# Patient Record
Sex: Female | Born: 1995 | Race: White | Hispanic: No | Smoking: Never smoker
Health system: Southern US, Community
[De-identification: ages and names within clinical notes are randomized; demographics above are authoritative.]

## PROBLEM LIST (undated history)

## (undated) DIAGNOSIS — I471 Supraventricular tachycardia, unspecified: Secondary | ICD-10-CM

## (undated) DIAGNOSIS — R109 Unspecified abdominal pain: Secondary | ICD-10-CM

## (undated) DIAGNOSIS — I951 Orthostatic hypotension: Secondary | ICD-10-CM

## (undated) DIAGNOSIS — G43909 Migraine, unspecified, not intractable, without status migrainosus: Secondary | ICD-10-CM

## (undated) DIAGNOSIS — I498 Other specified cardiac arrhythmias: Secondary | ICD-10-CM

## (undated) DIAGNOSIS — R000 Tachycardia, unspecified: Secondary | ICD-10-CM

## (undated) DIAGNOSIS — Q796 Ehlers-Danlos syndrome, unspecified: Secondary | ICD-10-CM

## (undated) DIAGNOSIS — Z973 Presence of spectacles and contact lenses: Secondary | ICD-10-CM

## (undated) DIAGNOSIS — M797 Fibromyalgia: Secondary | ICD-10-CM

## (undated) DIAGNOSIS — R002 Palpitations: Secondary | ICD-10-CM

## (undated) DIAGNOSIS — G90A Postural orthostatic tachycardia syndrome (POTS): Secondary | ICD-10-CM

## (undated) HISTORY — PX: MOUTH SURGERY: SHX715

## (undated) HISTORY — PX: THYROIDECTOMY, PARTIAL: SHX18

## (undated) HISTORY — DX: Unspecified abdominal pain: R10.9

---
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
## 2017-01-18 DIAGNOSIS — R000 Tachycardia, unspecified: Secondary | ICD-10-CM

## 2017-01-18 DIAGNOSIS — G90A Postural orthostatic tachycardia syndrome (POTS): Secondary | ICD-10-CM | POA: Insufficient documentation

## 2017-01-18 DIAGNOSIS — I951 Orthostatic hypotension: Secondary | ICD-10-CM

## 2017-01-18 DIAGNOSIS — I498 Other specified cardiac arrhythmias: Secondary | ICD-10-CM | POA: Insufficient documentation

## 2017-04-22 ENCOUNTER — Emergency Department (HOSPITAL_COMMUNITY): Payer: BLUE CROSS/BLUE SHIELD

## 2017-04-22 ENCOUNTER — Emergency Department (HOSPITAL_COMMUNITY)
Admission: EM | Admit: 2017-04-22 | Discharge: 2017-04-22 | Disposition: A | Discharge status: Home / Self Care | Payer: BLUE CROSS/BLUE SHIELD | Attending: Emergency Medicine | Admitting: Emergency Medicine

## 2017-04-22 ENCOUNTER — Encounter (HOSPITAL_COMMUNITY): Payer: Self-pay | Admitting: *Deleted

## 2017-04-22 DIAGNOSIS — Z79899 Other long term (current) drug therapy: Secondary | ICD-10-CM | POA: Diagnosis not present

## 2017-04-22 DIAGNOSIS — R109 Unspecified abdominal pain: Secondary | ICD-10-CM | POA: Diagnosis present

## 2017-04-22 DIAGNOSIS — K5000 Crohn's disease of small intestine without complications: Secondary | ICD-10-CM | POA: Insufficient documentation

## 2017-04-22 HISTORY — DX: Postural orthostatic tachycardia syndrome (POTS): G90.A

## 2017-04-22 HISTORY — DX: Other specified cardiac arrhythmias: I49.8

## 2017-04-22 HISTORY — DX: Tachycardia, unspecified: R00.0

## 2017-04-22 HISTORY — DX: Ehlers-Danlos syndrome, unspecified: Q79.60

## 2017-04-22 HISTORY — DX: Orthostatic hypotension: I95.1

## 2017-04-22 LAB — CBC WITH DIFFERENTIAL/PLATELET
Basophils Absolute: 0 10*3/uL (ref 0.0–0.1)
Basophils Relative: 0 %
Eosinophils Absolute: 0.2 10*3/uL (ref 0.0–0.7)
Eosinophils Relative: 2 %
HEMATOCRIT: 45 % (ref 36.0–46.0)
HEMOGLOBIN: 15.5 g/dL — AB (ref 12.0–15.0)
LYMPHS PCT: 31 %
Lymphs Abs: 2.7 10*3/uL (ref 0.7–4.0)
MCH: 30.8 pg (ref 26.0–34.0)
MCHC: 34.4 g/dL (ref 30.0–36.0)
MCV: 89.5 fL (ref 78.0–100.0)
MONO ABS: 1 10*3/uL (ref 0.1–1.0)
MONOS PCT: 11 %
NEUTROS ABS: 5 10*3/uL (ref 1.7–7.7)
Neutrophils Relative %: 56 %
Platelets: 200 10*3/uL (ref 150–400)
RBC: 5.03 MIL/uL (ref 3.87–5.11)
RDW: 12 % (ref 11.5–15.5)
WBC: 8.8 10*3/uL (ref 4.0–10.5)

## 2017-04-22 LAB — COMPREHENSIVE METABOLIC PANEL
ALBUMIN: 4.7 g/dL (ref 3.5–5.0)
ALT: 23 U/L (ref 14–54)
AST: 28 U/L (ref 15–41)
Alkaline Phosphatase: 59 U/L (ref 38–126)
Anion gap: 8 (ref 5–15)
BUN: 10 mg/dL (ref 6–20)
CHLORIDE: 105 mmol/L (ref 101–111)
CO2: 28 mmol/L (ref 22–32)
Calcium: 10 mg/dL (ref 8.9–10.3)
Creatinine, Ser: 0.7 mg/dL (ref 0.44–1.00)
GFR calc Af Amer: >60 mL/min (ref >60)
GFR calc non Af Amer: >60 mL/min (ref >60)
GLUCOSE: 89 mg/dL (ref 65–99)
POTASSIUM: 3.6 mmol/L (ref 3.5–5.1)
SODIUM: 141 mmol/L (ref 135–145)
Total Bilirubin: 0.4 mg/dL (ref 0.3–1.2)
Total Protein: 8.3 g/dL — ABNORMAL HIGH (ref 6.5–8.1)

## 2017-04-22 LAB — URINALYSIS, ROUTINE W REFLEX MICROSCOPIC
BILIRUBIN URINE: NEGATIVE
GLUCOSE, UA: NEGATIVE mg/dL
KETONES UR: NEGATIVE mg/dL
NITRITE: NEGATIVE
PH: 5 (ref 5.0–8.0)
Protein, ur: NEGATIVE mg/dL
SPECIFIC GRAVITY, URINE: 1.011 (ref 1.005–1.030)

## 2017-04-22 LAB — LIPASE, BLOOD: Lipase: 30 U/L (ref 11–51)

## 2017-04-22 LAB — PREGNANCY, URINE: Preg Test, Ur: NEGATIVE

## 2017-04-22 MED ORDER — HYDROCODONE-ACETAMINOPHEN 5-325 MG PO TABS: 1.0000 | ORAL_TABLET | Freq: Four times a day (QID) | ORAL | 0 refills | Status: DC | PRN

## 2017-04-22 MED ORDER — SODIUM CHLORIDE 0.9 % IV SOLN
INTRAVENOUS | Status: DC
  Administered 2017-04-22: 21:00:00 via INTRAVENOUS

## 2017-04-22 MED ORDER — ONDANSETRON HCL 4 MG/2ML IJ SOLN
4.0000 mg | Freq: Once | INTRAMUSCULAR | Status: AC
  Administered 2017-04-22: 4 mg via INTRAVENOUS
  Filled 2017-04-22: qty 2

## 2017-04-22 MED ORDER — SODIUM CHLORIDE 0.9 % IV BOLUS (SEPSIS)
500.0000 mL | Freq: Once | INTRAVENOUS | Status: AC
  Administered 2017-04-22: 500 mL via INTRAVENOUS

## 2017-04-22 MED ORDER — IOPAMIDOL (ISOVUE-300) INJECTION 61%
100.0000 mL | Freq: Once | INTRAVENOUS | Status: AC | PRN
Start: 2017-04-22 — End: 2017-04-22
  Administered 2017-04-22: 100 mL via INTRAVENOUS

## 2017-04-22 NOTE — ED Provider Notes (Signed)
[facility] Provider Note   CSN: [id number] Arrival date & time: 04/22/17  1910     History   Chief Complaint Chief Complaint  Patient presents with  . Abdominal Pain    HPI Katelyn Vazquez is a 21 y.o. female.  Patient with the complaint of left-sided lower abdominal pain also some abdominal pain all over. Started last night. Seems to be worse with food. No nausea no vomiting no diarrhea. No fevers no dysuria. Patient's had similar symptoms on and off for the past year. Originally thought maybe it was related to GYN things had a GYN follow-up and ultrasound with no acute findings.      Past Medical History:  Diagnosis Date  . Ehlers-Danlos syndrome   . POTS (postural orthostatic tachycardia syndrome)     There are no active problems to display for this patient.   Past Surgical History:  Procedure Laterality Date  . MOUTH SURGERY      OB History    No data available       Home Medications    Prior to Admission medications   Medication Sig Start Date End Date Taking? Authorizing Provider  diltiazem (DILACOR XR) 120 MG 24 hr capsule Take 120 mg by mouth daily.   Yes [provider]  drospirenone-ethinyl estradiol (NIKKI) 3-0.02 MG tablet Take 1 tablet by mouth daily.   Yes [provider]  Eyelid Cleansers (AVENOVA) 0.01 % SOLN Apply 1 application topically daily.   Yes [provider]  fludrocortisone (FLORINEF) 0.1 MG tablet Take 0.2 mg by mouth daily.   Yes [provider]  gabapentin (NEURONTIN) 100 MG capsule Take 100 mg by mouth 2 (two) times daily.   Yes [provider]  loratadine (CLARITIN) 10 MG tablet Take 10 mg by mouth daily.   Yes [provider]  midodrine (PROAMATINE) 2.5 MG tablet Take 2.5 mg by mouth 3 (three) times daily with meals.   Yes [provider]  Multiple Vitamin (MULTIVITAMIN WITH MINERALS) TABS tablet Take 1 tablet by mouth daily.   Yes [provider]    Probiotic Product ([employer] ADV PROBIOTIC GUMMIES PO) Take 2 each by mouth daily.   Yes [provider]  SUMAtriptan (IMITREX) 50 MG tablet Take 50 mg by mouth every 2 (two) hours as needed for migraine. May repeat in 2 hours if headache persists or recurs.   Yes [provider]  HYDROcodone-acetaminophen (NORCO/VICODIN) 5-325 MG tablet Take 1-2 tablets by mouth every 6 (six) hours as needed. 04/22/17   Vanetta Mulders, MD    Family History History reviewed. No pertinent family history.  Social History Social History  Substance Use Topics  . Smoking status: Never Smoker  . Smokeless tobacco: Never Used  . Alcohol use Yes     Comment: occasionally     Allergies   Azithromycin   Review of Systems Review of Systems  Constitutional: Negative for fever.  HENT: Negative for congestion.   Eyes: Negative for visual disturbance.  Respiratory: Negative for shortness of breath.   Cardiovascular: Negative for chest pain.  Gastrointestinal: Positive for abdominal pain.  Genitourinary: Negative for dysuria and hematuria.  Musculoskeletal: Negative for back pain.  Skin: Negative for rash.  Neurological: Negative for headaches.  Hematological: Does not bruise/bleed easily.  Psychiatric/Behavioral: Negative for confusion.     Physical Exam Updated Vital Signs BP 103/70 (BP Location: Right Arm)   Pulse 69   Temp 98.6 F (37 C)   Resp 19   Ht  1.727 m (5\' 8" )   Wt 60.8 kg (134 lb)   LMP 04/16/2017   SpO2 100%   BMI 20.37 kg/m   Physical Exam  Constitutional: She is oriented to person, place, and time. She appears well-developed and well-nourished. No distress.  HENT:  Head: Normocephalic and atraumatic.  Mouth/Throat: Oropharynx is clear and moist.  Eyes: Pupils are equal, round, and reactive to light. Conjunctivae and EOM are normal.  Neck: Normal range of motion. Neck supple.  Cardiovascular: Normal rate and regular rhythm.   Pulmonary/Chest: Effort normal  and breath sounds normal.  Abdominal: Soft. Bowel sounds are normal. She exhibits no distension. There is no tenderness. There is no guarding.  Musculoskeletal: Normal range of motion.  Neurological: She is alert and oriented to person, place, and time. No cranial nerve deficit or sensory deficit. She exhibits normal muscle tone. Coordination normal.  Skin: Skin is warm. No rash noted.  Nursing note and vitals reviewed.    ED Treatments / Results  Labs (all labs ordered are listed, but only abnormal results are displayed) Labs Reviewed  COMPREHENSIVE METABOLIC PANEL - Abnormal; Notable for the following:       Result Value   Total Protein 8.3 (*)    All other components within normal limits  CBC WITH DIFFERENTIAL/PLATELET - Abnormal; Notable for the following:    Hemoglobin 15.5 (*)    All other components within normal limits  URINALYSIS, ROUTINE W REFLEX MICROSCOPIC - Abnormal; Notable for the following:    Color, Urine STRAW (*)    APPearance CLOUDY (*)    Hgb urine dipstick SMALL (*)    Leukocytes, UA LARGE (*)    Bacteria, UA MANY (*)    Squamous Epithelial / LPF TOO NUMEROUS TO COUNT (*)    Non Squamous Epithelial 0-5 (*)    All other components within normal limits  LIPASE, BLOOD  PREGNANCY, URINE    EKG  EKG Interpretation None       Radiology Ct Abdomen Pelvis W Contrast  Result Date: 04/22/2017 CLINICAL DATA:  Abdominal pain, onset last night. EXAM: CT ABDOMEN AND PELVIS WITH CONTRAST TECHNIQUE: Multidetector CT imaging of the abdomen and pelvis was performed using the standard protocol following bolus administration of intravenous contrast. CONTRAST:  100mL ISOVUE-300 IOPAMIDOL (ISOVUE-300) INJECTION 61% COMPARISON:  None. FINDINGS: Lower chest: The lung bases are clear. Hepatobiliary: No focal hepatic lesion. Mild focal fatty infiltration adjacent with falciform ligament. No gallstones, gallbladder wall thickening, or biliary dilatation. Pancreas: No ductal  dilatation or inflammation. Detailed evaluation limited due to adjacent decompressed bowel. Spleen: Normal in size without focal abnormality. Adrenals/Urinary Tract: Adrenal glands are unremarkable. Kidneys are normal, without renal evident calculi, focal lesion, or hydronephrosis. Bladder is unremarkable. Stomach/Bowel: Lack of enteric contrast and paucity of intra-abdominal fat limits bowel assessment. Probable wall thickening and mesenteric edema of moderate length segment of distal and terminal ileum. No additional bowel inflammation. No bowel obstruction. Moderate colonic stool burden. The appendix not confidently visualized. Vascular/Lymphatic: Normal caliber abdominal aorta, no evidence of aortic dissection. Circumaortic left renal vein. Prominent ileocecal lymph nodes, largest measuring 7 mm. Reproductive: Uterus and bilateral adnexa are unremarkable. Ovaries symmetric in size. Other: Small amount of free fluid in the pelvis and right lower quadrant. No free air. No intra-abdominal abscess. Musculoskeletal: There are no acute or suspicious osseous abnormalities. IMPRESSION: 1. Moderate length segment of distal and terminal ileum wall thickening and mesenteric edema consistent with enteritis, this may be infectious or inflammatory. Given distribution, Crohn's  disease is considered. 2. Prominent ileocolic lymph nodes and small amount of free fluid in the pelvis are likely reactive. Electronically Signed   By: Rubye Oaks M.D.   On: 04/22/2017 21:28    Procedures Procedures (including critical care time)  Medications Ordered in ED Medications  0.9 %  sodium chloride infusion ( Intravenous New Bag/Given 04/22/17 2030)  sodium chloride 0.9 % bolus 500 mL (0 mLs Intravenous Stopped 04/22/17 2130)  ondansetron (ZOFRAN) injection 4 mg (4 mg Intravenous Given 04/22/17 2027)  iopamidol (ISOVUE-300) 61 % injection 100 mL (100 mLs Intravenous Contrast Given 04/22/17 2104)     Initial Impression /  Assessment and Plan / ED Course  I have reviewed the triage vital signs and the nursing notes.  Pertinent labs & imaging results that were available during my care of the patient were reviewed by me and considered in my medical decision making (see chart for details).     Patient's workup raises the clinical suspicion for Crohn's disease. CT scan shows inflammation of the terminal ileum. Patient's been having some GI symptoms mild on and off or about a year. No complicating factors. No acute abdomen. Will refer patient to follow-up with gastroenterology.  Final Clinical Impressions(s) / ED Diagnoses   Final diagnoses:  Terminal ileitis without complication (HCC)    New Prescriptions New Prescriptions   HYDROCODONE-ACETAMINOPHEN (NORCO/VICODIN) 5-325 MG TABLET    Take 1-2 tablets by mouth every 6 (six) hours as needed.     Vanetta Mulders, MD 04/22/17 2152

## 2017-04-22 NOTE — ED Triage Notes (Signed)
Pt reports abdominal pain that started last night. Pt states the pain is worse after she eats. Pt denies n/v/d or any urinary symptoms.

## 2017-04-22 NOTE — Discharge Instructions (Signed)
Findings on CT scan suggestive of Crohn's disease. Follow-up with gastroenterology referral information provided. Return for any new or worse symptoms. Take pain medicine as needed.

## 2017-04-26 ENCOUNTER — Encounter (INDEPENDENT_AMBULATORY_CARE_PROVIDER_SITE_OTHER): Payer: Self-pay | Admitting: Internal Medicine

## 2017-05-04 ENCOUNTER — Encounter (INDEPENDENT_AMBULATORY_CARE_PROVIDER_SITE_OTHER): Payer: Self-pay | Admitting: Internal Medicine

## 2017-05-04 ENCOUNTER — Ambulatory Visit (INDEPENDENT_AMBULATORY_CARE_PROVIDER_SITE_OTHER): Payer: BLUE CROSS/BLUE SHIELD | Admitting: Internal Medicine

## 2017-05-04 DIAGNOSIS — R109 Unspecified abdominal pain: Secondary | ICD-10-CM

## 2017-05-04 DIAGNOSIS — R1030 Lower abdominal pain, unspecified: Secondary | ICD-10-CM | POA: Diagnosis not present

## 2017-05-04 HISTORY — DX: Unspecified abdominal pain: R10.9

## 2017-05-04 NOTE — Progress Notes (Signed)
Subjective:    Patient ID: Katelyn Vazquez, female    DOB: 1996/05/27, 21 y.o.   MRN: [id number]  HPI Referred by the ED 04/10/29/18  for abdominal pain. Seen in the ED and underwent a CT which revealed:  IMPRESSION: 1. Moderate length segment of distal and terminal ileum wall thickening and mesenteric edema consistent with enteritis, this may be infectious or inflammatory. Given distribution, Crohn's disease is considered. 2. Prominent ileocolic lymph nodes and small amount of free fluid in the pelvis are likely reactive. She tells me she has been okay since then. Some days she has no appetite. She has some abdominal discomfort. She has been avoiding gluten and dairy products. In the ED, she rated the pain at an 8. Symptoms started 2 days before she went to the ED. No fever.  She ate at [facility] hut before ED visit.  She feels 40% better. She is back in school.  She has a BM x 1 a day. Sometimes she has urgency. Rarely sees blood.  For the most part her appetite is good.     CBC    Component Value Date/Time   WBC 8.8 04/22/2017 1952   RBC 5.03 04/22/2017 1952   HGB 15.5 (H) 04/22/2017 1952   HCT 45.0 04/22/2017 1952   PLT 200 04/22/2017 1952   MCV 89.5 04/22/2017 1952   MCH 30.8 04/22/2017 1952   MCHC 34.4 04/22/2017 1952   RDW 12.0 04/22/2017 1952   LYMPHSABS 2.7 04/22/2017 1952   MONOABS 1.0 04/22/2017 1952   EOSABS 0.2 04/22/2017 1952   BASOSABS 0.0 04/22/2017 1952    CMP Latest Ref Rng & Units 04/22/2017  Glucose 65 - 99 mg/dL 89  BUN 6 - 20 mg/dL 10  Creatinine 4.09 - 8.11 mg/dL 9.14  Sodium 782 - 956 mmol/L 141  Potassium 3.5 - 5.1 mmol/L 3.6  Chloride 101 - 111 mmol/L 105  CO2 22 - 32 mmol/L 28  Calcium 8.9 - 10.3 mg/dL 21.3  Total Protein 6.5 - 8.1 g/dL 8.3(H)  Total Bilirubin 0.3 - 1.2 mg/dL 0.4  Alkaline Phos 38 - 126 U/L 59  AST 15 - 41 U/L 28  ALT 14 - 54 U/L 23     Review of Systems Past Medical History:  Diagnosis Date  . Abdominal pain 05/04/2017  .  Ehlers-Danlos syndrome   . POTS (postural orthostatic tachycardia syndrome)     Past Surgical History:  Procedure Laterality Date  . MOUTH SURGERY      Allergies  Allergen Reactions  . Azithromycin Hives    Current Outpatient Prescriptions on File Prior to Visit  Medication Sig Dispense Refill  . diltiazem (DILACOR XR) 120 MG 24 hr capsule Take 120 mg by mouth daily.    . drospirenone-ethinyl estradiol (NIKKI) 3-0.02 MG tablet Take 1 tablet by mouth daily.    . Eyelid Cleansers (AVENOVA) 0.01 % SOLN Apply 1 application topically daily.    . fludrocortisone (FLORINEF) 0.1 MG tablet Take 0.2 mg by mouth daily.    Marland Kitchen gabapentin (NEURONTIN) 100 MG capsule Take 100 mg by mouth 2 (two) times daily.    Marland Kitchen loratadine (CLARITIN) 10 MG tablet Take 10 mg by mouth daily.    . midodrine (PROAMATINE) 2.5 MG tablet Take 2.5 mg by mouth 3 (three) times daily with meals.    . Multiple Vitamin (MULTIVITAMIN WITH MINERALS) TABS tablet Take 1 tablet by mouth daily.    . Probiotic Product ([employer]S ADV PROBIOTIC GUMMIES PO) Take 2 each by  mouth daily.    . SUMAtriptan (IMITREX) 50 MG tablet Take 50 mg by mouth every 2 (two) hours as needed for migraine. May repeat in 2 hours if headache persists or recurs.     No current facility-administered medications on file prior to visit.         Objective:   Physical Exam Blood pressure 94/72, pulse 72, temperature 98.5 F (36.9 C), height  (1.727 m), weight 132 lb (59.9 kg), last menstrual period 04/16/2017. Alert and oriented. Skin warm and dry. Oral mucosa is moist.   . Sclera anicteric, conjunctivae is pink. Thyroid not enlarged. No cervical lymphadenopathy. Lungs clear. Heart regular rate and rhythm.  Abdomen is soft. Bowel sounds are positive. No hepatomegaly. No abdominal masses felt. No tenderness.  No edema to lower extremities.           Assessment & Plan:  LLQ pain. Am going to rule out IBD. CBC, sedrate, and Inflammatory bowel disease panel. CT  abdomen/pelvis with CM to see if there is any change.

## 2017-05-04 NOTE — Patient Instructions (Signed)
CBC, Sedrate, inflammatory bowel disease. CT abdomen/pelvis with CM.

## 2017-05-09 LAB — INFLAM. BOWEL DISEASE DIFF. PANEL
ANCA SCREEN: NEGATIVE
Myeloperoxidase Abs: <1 AI (ref <1.0)
SACCHAROMYCES CEREVISIAE AB (ASCA)(IGA): 7.3 U (ref <=20.0)
SACCHAROMYCES CEREVISIAE AB (ASCA)(IGG): 10.8 U (ref <=20.0)
Serine Protease 3: <1 AI (ref <1.0)

## 2017-05-09 LAB — SEDIMENTATION RATE: SED RATE: 2 mm/h (ref 0–20)

## 2017-05-18 ENCOUNTER — Ambulatory Visit (INDEPENDENT_AMBULATORY_CARE_PROVIDER_SITE_OTHER): Payer: BLUE CROSS/BLUE SHIELD | Admitting: Internal Medicine

## 2017-05-21 ENCOUNTER — Encounter (HOSPITAL_COMMUNITY): Payer: Self-pay

## 2017-05-21 ENCOUNTER — Ambulatory Visit (HOSPITAL_COMMUNITY)
Admission: RE | Admit: 2017-05-21 | Discharge: 2017-05-21 | Disposition: A | Discharge status: Home / Self Care | Payer: BLUE CROSS/BLUE SHIELD | Source: Ambulatory Visit | Attending: Internal Medicine | Admitting: Internal Medicine

## 2017-05-21 DIAGNOSIS — R590 Localized enlarged lymph nodes: Secondary | ICD-10-CM | POA: Insufficient documentation

## 2017-05-21 DIAGNOSIS — R109 Unspecified abdominal pain: Secondary | ICD-10-CM | POA: Insufficient documentation

## 2017-05-21 MED ORDER — IOPAMIDOL (ISOVUE-300) INJECTION 61%
100.0000 mL | Freq: Once | INTRAVENOUS | Status: AC | PRN
  Administered 2017-05-21: 100 mL via INTRAVENOUS

## 2017-05-27 ENCOUNTER — Encounter (INDEPENDENT_AMBULATORY_CARE_PROVIDER_SITE_OTHER): Payer: Self-pay | Admitting: Internal Medicine

## 2017-05-27 ENCOUNTER — Telehealth (INDEPENDENT_AMBULATORY_CARE_PROVIDER_SITE_OTHER): Payer: Self-pay | Admitting: Internal Medicine

## 2017-05-27 NOTE — Telephone Encounter (Signed)
Hope, OV in 3 months

## 2017-05-27 NOTE — Telephone Encounter (Signed)
Patient was given an appointment for 08/27/17 at 9:30am w/Terri Valetta FullerSetzer, NP.  A letter was mailed to the patient.

## 2017-06-16 ENCOUNTER — Encounter (INDEPENDENT_AMBULATORY_CARE_PROVIDER_SITE_OTHER): Payer: Self-pay | Admitting: Internal Medicine

## 2017-08-27 ENCOUNTER — Ambulatory Visit (INDEPENDENT_AMBULATORY_CARE_PROVIDER_SITE_OTHER): Payer: BLUE CROSS/BLUE SHIELD | Admitting: Internal Medicine

## 2017-09-14 ENCOUNTER — Encounter (INDEPENDENT_AMBULATORY_CARE_PROVIDER_SITE_OTHER): Payer: Self-pay | Admitting: Internal Medicine

## 2017-09-14 ENCOUNTER — Ambulatory Visit (INDEPENDENT_AMBULATORY_CARE_PROVIDER_SITE_OTHER): Payer: BLUE CROSS/BLUE SHIELD | Admitting: Internal Medicine

## 2017-09-14 VITALS — BP 98/80 | HR 72 | Temp 98.1°F | Ht 68.0 in | Wt 129.9 lb

## 2017-09-14 DIAGNOSIS — R1030 Lower abdominal pain, unspecified: Secondary | ICD-10-CM | POA: Diagnosis not present

## 2017-09-14 LAB — CBC WITH DIFFERENTIAL/PLATELET
Basophils Absolute: 42 cells/uL (ref 0–200)
Basophils Relative: 0.5 %
EOS PCT: 1.1 %
Eosinophils Absolute: 92 cells/uL (ref 15–500)
HCT: 41.4 % (ref 35.0–45.0)
Hemoglobin: 14.5 g/dL (ref 11.7–15.5)
Lymphs Abs: 1882 cells/uL (ref 850–3900)
MCH: 30.5 pg (ref 27.0–33.0)
MCHC: 35 g/dL (ref 32.0–36.0)
MCV: 87.2 fL (ref 80.0–100.0)
MONOS PCT: 6.3 %
MPV: 11.1 fL (ref 7.5–12.5)
NEUTROS PCT: 69.7 %
Neutro Abs: 5855 cells/uL (ref 1500–7800)
PLATELETS: 250 10*3/uL (ref 140–400)
RBC: 4.75 10*6/uL (ref 3.80–5.10)
RDW: 11.9 % (ref 11.0–15.0)
TOTAL LYMPHOCYTE: 22.4 %
WBC mixed population: 529 cells/uL (ref 200–950)
WBC: 8.4 10*3/uL (ref 3.8–10.8)

## 2017-09-14 LAB — SEDIMENTATION RATE: SED RATE: 2 mm/h (ref 0–20)

## 2017-09-14 NOTE — Progress Notes (Signed)
Subjective:    Patient ID: Katelyn Vazquez, female    DOB: 08-Aug-1996, 22 y.o.   MRN: [id number]  HPI Here today for f/u. Last seen in September of 2018. Had been seen in the ED in September for abdominal pain. CT abdomen revealed:IMPRESSION: 1. Moderate length segment of distal and terminal ileum wall thickening and mesenteric edema consistent with enteritis, this may be infectious or inflammatory. Given distribution, Crohn's disease is considered. 2. Prominent ileocolic lymph nodes and small amount of free fluid in the pelvis are likely reactive. She tells me today she is doing good. She  [place] and [place] virus. Presently taking Cipro. Symptoms were diarrhea. She says she lost 10 pounds. She says everyone caught this virus. (Astro Virus). She tells me she is doing okay. She says she does not feel 100% better. She does have some bloating.  Her appetite is okay.   Follow up CT abdomen/pelvis with CM revealed (05/21/2017) IMPRESSION: Persistently enlarged right ileocolic mesenteric lymph nodes, measuring up to 11 mm in short axis.  Interval resolution of the previously noted inflammatory changes of the distal ileum.  No evidence of acute or significant abnormality within the solid abdominal organs.    CBC    Component Value Date/Time   WBC 8.8 04/22/2017 1952   RBC 5.03 04/22/2017 1952   HGB 15.5 (H) 04/22/2017 1952   HCT 45.0 04/22/2017 1952   PLT 200 04/22/2017 1952   MCV 89.5 04/22/2017 1952   MCH 30.8 04/22/2017 1952   MCHC 34.4 04/22/2017 1952   RDW 12.0 04/22/2017 1952   LYMPHSABS 2.7 04/22/2017 1952   MONOABS 1.0 04/22/2017 1952   EOSABS 0.2 04/22/2017 1952   BASOSABS 0.0 04/22/2017 1952   Erythrocyte Sedimentation Rate     Component Value Date/Time   ESRSEDRATE 2 05/04/2017 1147   04/22/2017 IBD was normal. ( h discussed this case with Dr. Karilyn Cotaehman).    Review of Systems Past Medical History:  Diagnosis Date  . Abdominal pain  05/04/2017  . Ehlers-Danlos syndrome   . POTS (postural orthostatic tachycardia syndrome)     Past Surgical History:  Procedure Laterality Date  . MOUTH SURGERY      Allergies  Allergen Reactions  . Azithromycin Hives    Current Outpatient Medications on File Prior to Visit  Medication Sig Dispense Refill  . ciprofloxacin (CIPRO) 250 MG tablet Take by mouth.    . diltiazem (DILACOR XR) 120 MG 24 hr capsule Take 120 mg by mouth daily.    . drospirenone-ethinyl estradiol (NIKKI) 3-0.02 MG tablet Take 1 tablet by mouth daily.    . Eyelid Cleansers (AVENOVA) 0.01 % SOLN Apply 1 application topically daily.    . fludrocortisone (FLORINEF) 0.1 MG tablet Take 0.2 mg by mouth daily.    Marland Kitchen. gabapentin (NEURONTIN) 100 MG capsule Take 100 mg by mouth 2 (two) times daily.    Marland Kitchen. gabapentin (NEURONTIN) 300 MG capsule Take 300 mg by mouth as needed.    . loratadine (CLARITIN) 10 MG tablet Take 10 mg by mouth daily.    . midodrine (PROAMATINE) 2.5 MG tablet Take 2.5 mg by mouth 3 (three) times daily with meals.    . Multiple Vitamin (MULTIVITAMIN WITH MINERALS) TABS tablet Take 1 tablet by mouth daily.    . Probiotic Product (CVS ADV PROBIOTIC GUMMIES PO) Take 2 each by mouth daily.    . SUMAtriptan (IMITREX) 50 MG tablet Take 50 mg by mouth every 2 (two) hours as  needed for migraine. May repeat in 2 hours if headache persists or recurs.     No current facility-administered medications on file prior to visit.         Objective:   Physical Exam Blood pressure 98/80, pulse 72, temperature 98.1 F (36.7 C), height 5\' 8"  (1.727 m), weight 129 lb 14.4 oz (58.9 kg).  Alert and oriented. Skin warm and dry. Oral mucosa is moist.   . Sclera anicteric, conjunctivae is pink. Thyroid not enlarged. No cervical lymphadenopathy. Lungs clear. Heart regular rate and rhythm.  Abdomen is soft. Bowel sounds are positive. No hepatomegaly. No abdominal masses felt. No tenderness.  No edema to lower  extremities.        Assessment & Plan:  Lower abdominal pain. CBC and sedrate.  OV in 1 yrs.

## 2017-09-14 NOTE — Patient Instructions (Signed)
Labs today. OV in 1 y ear.  

## 2018-05-11 ENCOUNTER — Other Ambulatory Visit: Payer: Self-pay | Admitting: Nurse Practitioner

## 2018-08-18 IMAGING — CT CT ABD-PELV W/ CM
2 of 3 series · 16 of 46 positions shown, 18 images · IV contrast (Isovue)
Comparison: None.

CLINICAL DATA: Abdominal pain, onset last night.

EXAM:
CT ABDOMEN AND PELVIS WITH CONTRAST
TECHNIQUE: Multidetector CT imaging of the abdomen and pelvis was performed
using the standard protocol following bolus administration of
intravenous contrast.
CONTRAST:  100mL [id number] IOPAMIDOL (GA3PP8-H55) INJECTION 61%

[Series 2: axial st · axial · 0.63mm/px · z∈[-588,-194]mm · 13 of 91 slices shown, 15 images]
[im 6/91  soft-tissue]
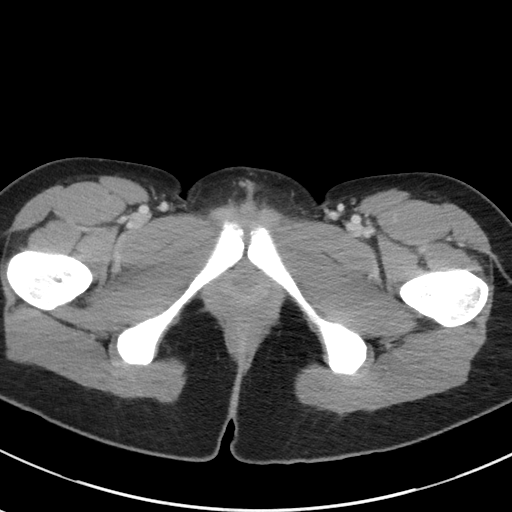
[im 6/91  bone]
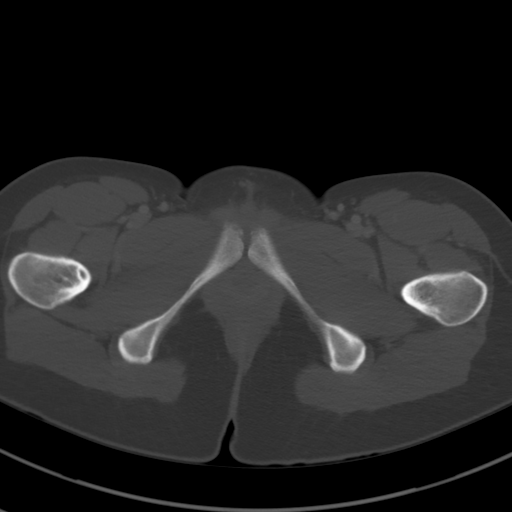
[im 12/91  soft-tissue]
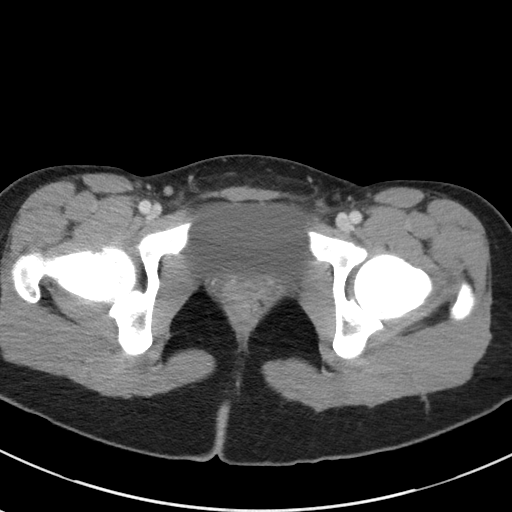
[im 18/91  soft-tissue]
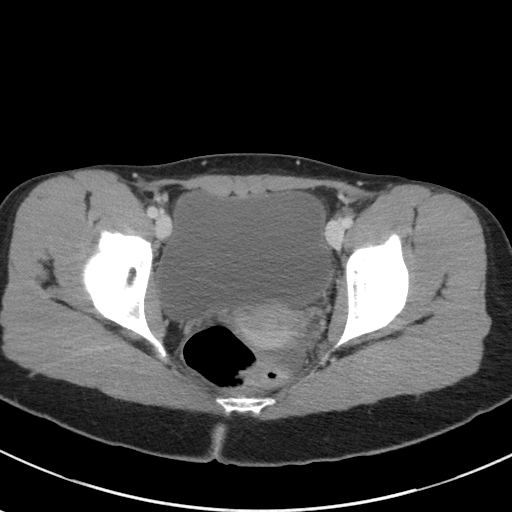
[im 27/91  soft-tissue]
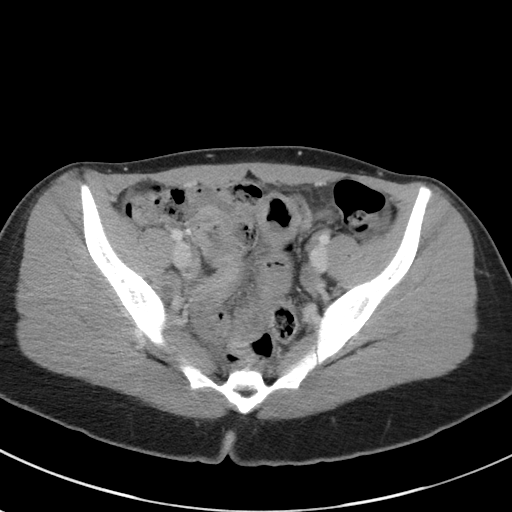
[im 32/91  soft-tissue]
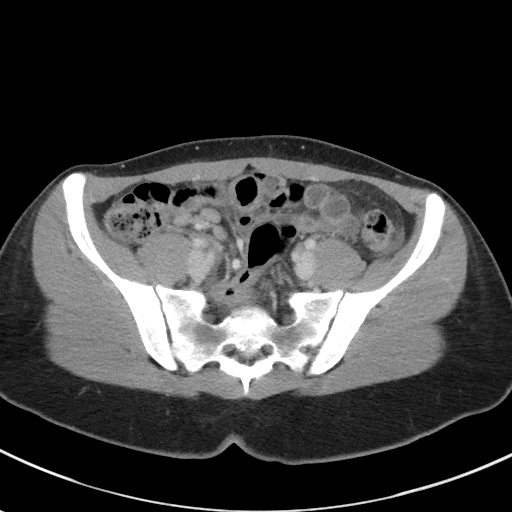
[im 38/91  soft-tissue]
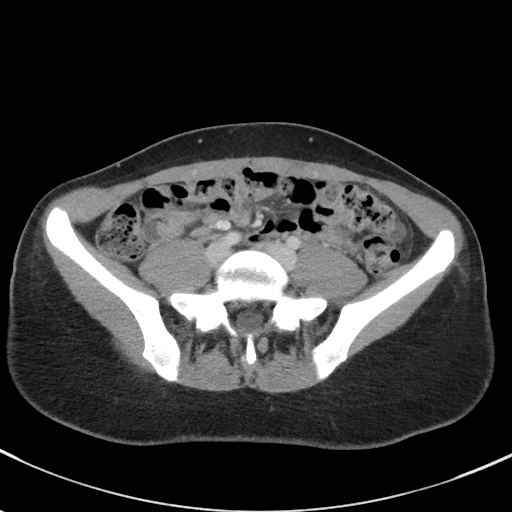
[im 47/91  soft-tissue]
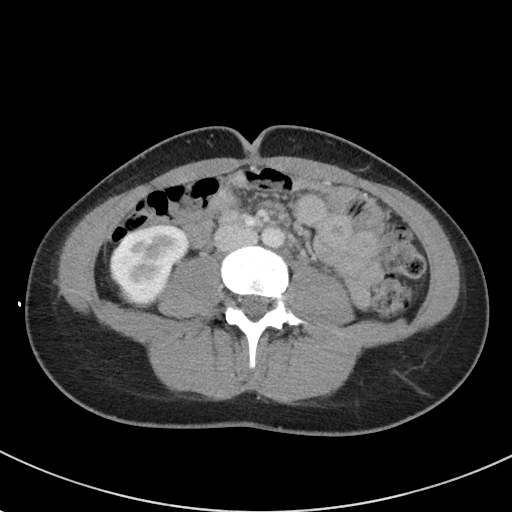
[im 53/91  soft-tissue]
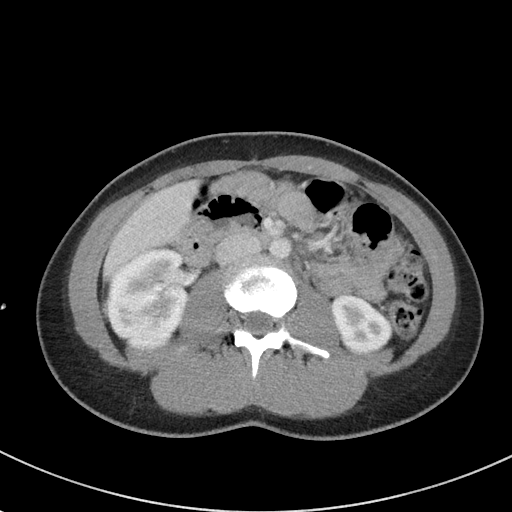
[im 59/91  soft-tissue]
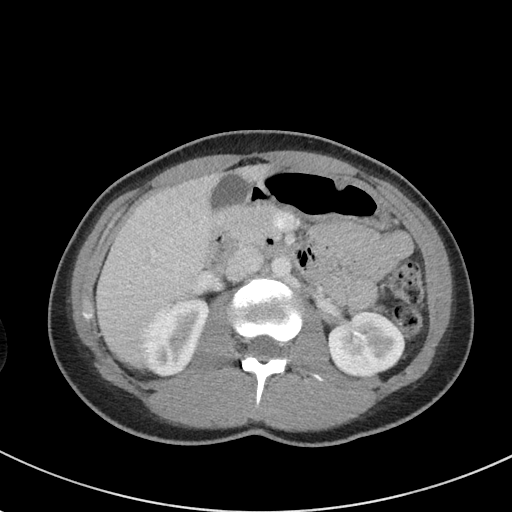
[im 59/91  bone]
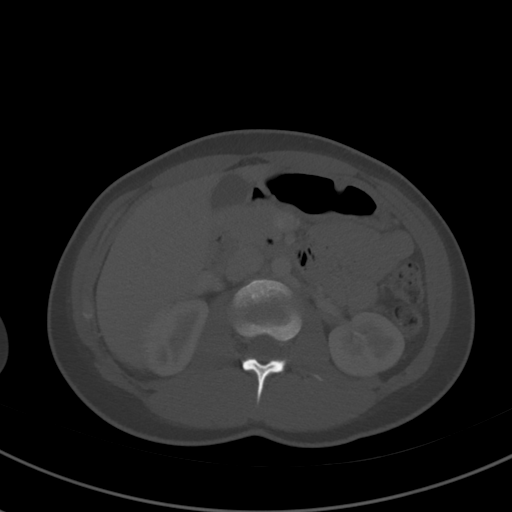
[im 64/91  soft-tissue]
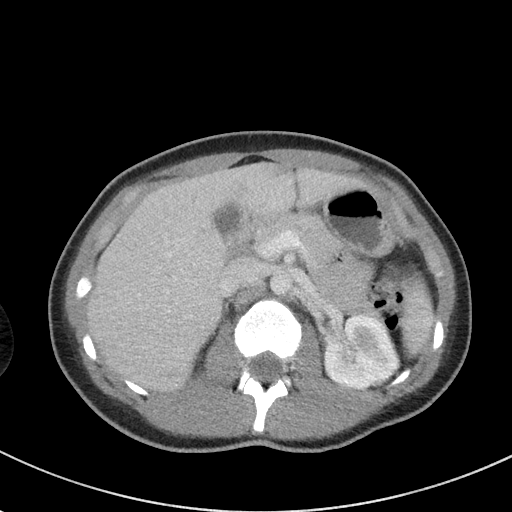
[im 73/91  soft-tissue]
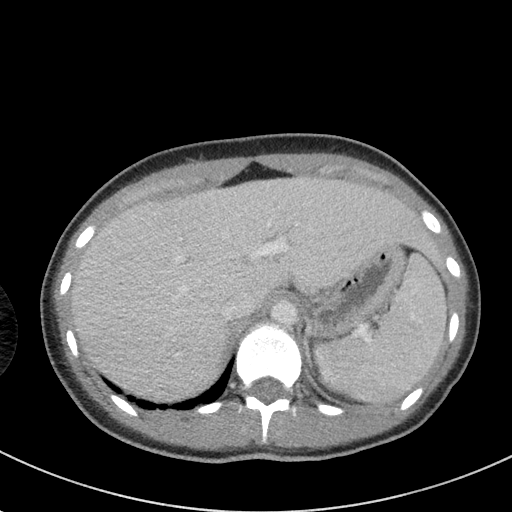
[im 79/91  soft-tissue]
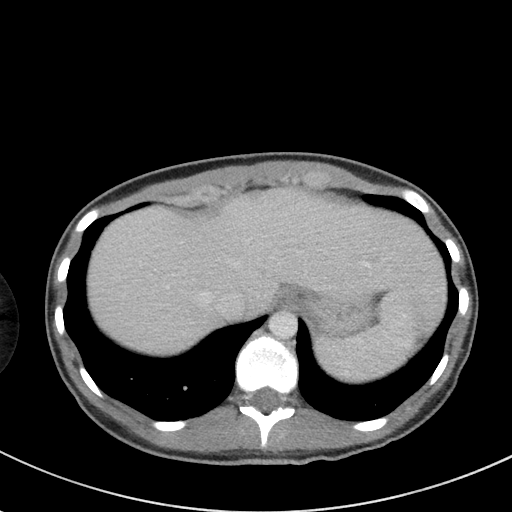
[im 85/91  soft-tissue]
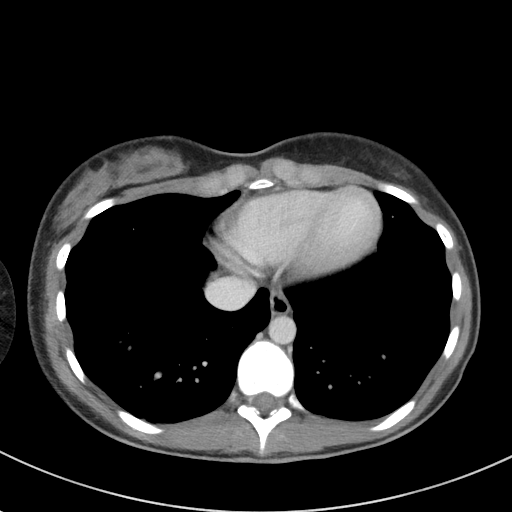

[Series 5: coronal st · coronal · 0.66mm/px · 3 of 68 slices shown]
[im 23/68  soft-tissue]
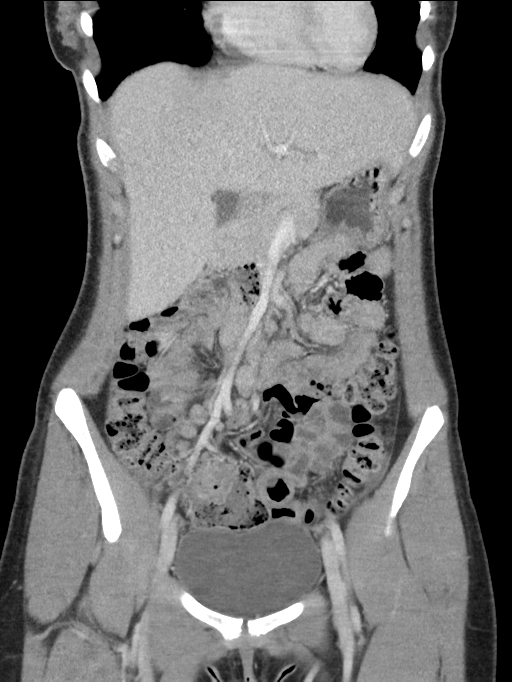
[im 30/68  soft-tissue]
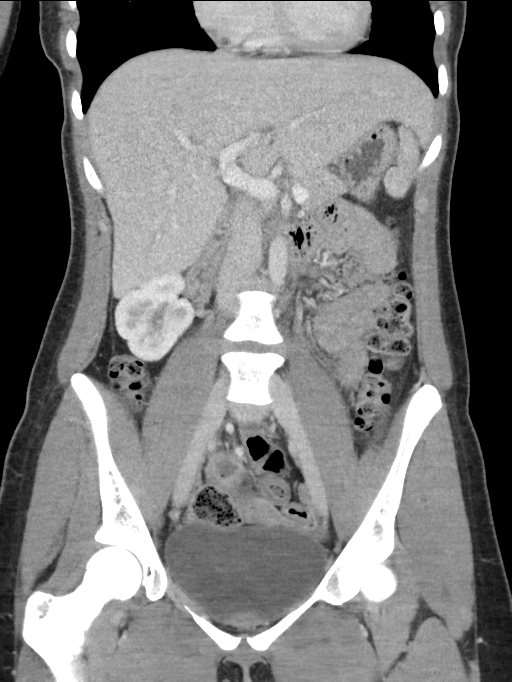
[im 38/68  soft-tissue]
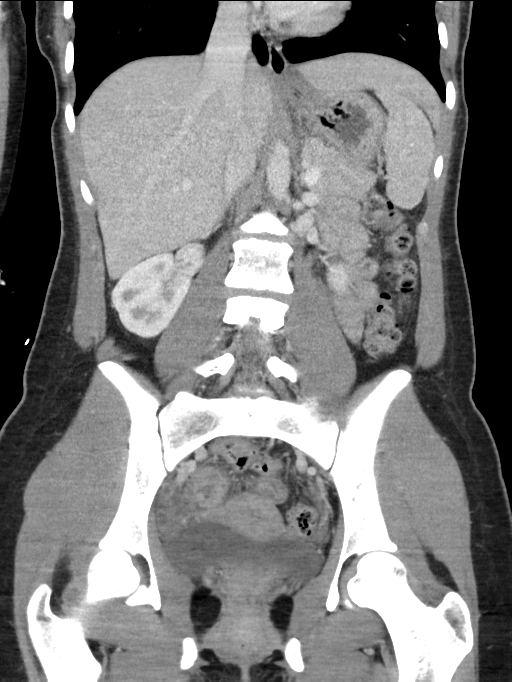

[16 of 46 positions shown; findings below may reference images not displayed]

FINDINGS: Lower chest: The lung bases are clear.

Hepatobiliary: No focal hepatic lesion. Mild focal fatty
infiltration adjacent with falciform ligament. No gallstones,
gallbladder wall thickening, or biliary dilatation.

Pancreas: No ductal dilatation or inflammation. Detailed evaluation
limited due to adjacent decompressed bowel.

Spleen: Normal in size without focal abnormality.

Adrenals/Urinary Tract: Adrenal glands are unremarkable. Kidneys are
normal, without renal evident calculi, focal lesion, or
hydronephrosis. Bladder is unremarkable.

Stomach/Bowel: Lack of enteric contrast and paucity of
intra-abdominal fat limits bowel assessment. Probable wall
thickening and mesenteric edema of moderate length segment of distal
and terminal ileum. No additional bowel inflammation. No bowel
obstruction. Moderate colonic stool burden. The appendix not
confidently visualized.

Vascular/Lymphatic: Normal caliber abdominal aorta, no evidence of
aortic dissection. Circumaortic left renal vein. Prominent ileocecal
lymph nodes, largest measuring 7 mm.

Reproductive: Uterus and bilateral adnexa are unremarkable. Ovaries
symmetric in size.

Other: Small amount of free fluid in the pelvis and right lower
quadrant. No free air. No intra-abdominal abscess.

Musculoskeletal: There are no acute or suspicious osseous
abnormalities.
IMPRESSION: 1. Moderate length segment of distal and terminal ileum wall
thickening and mesenteric edema consistent with enteritis, this may
be infectious or inflammatory. Given distribution, Crohn's disease
is considered.
2. Prominent ileocolic lymph nodes and small amount of free fluid in
the pelvis are likely reactive.

## 2018-08-28 ENCOUNTER — Emergency Department
Admission: EM | Admit: 2018-08-28 | Discharge: 2018-08-28 | Disposition: A | Discharge status: Home / Self Care | Payer: BLUE CROSS/BLUE SHIELD | Attending: Emergency Medicine | Admitting: Emergency Medicine

## 2018-08-28 ENCOUNTER — Emergency Department: Payer: BLUE CROSS/BLUE SHIELD

## 2018-08-28 ENCOUNTER — Other Ambulatory Visit: Payer: Self-pay

## 2018-08-28 DIAGNOSIS — X509XXA Other and unspecified overexertion or strenuous movements or postures, initial encounter: Secondary | ICD-10-CM | POA: Diagnosis not present

## 2018-08-28 DIAGNOSIS — Y9389 Activity, other specified: Secondary | ICD-10-CM | POA: Insufficient documentation

## 2018-08-28 DIAGNOSIS — S39012A Strain of muscle, fascia and tendon of lower back, initial encounter: Secondary | ICD-10-CM | POA: Insufficient documentation

## 2018-08-28 DIAGNOSIS — Y998 Other external cause status: Secondary | ICD-10-CM | POA: Insufficient documentation

## 2018-08-28 DIAGNOSIS — S3992XA Unspecified injury of lower back, initial encounter: Secondary | ICD-10-CM | POA: Diagnosis present

## 2018-08-28 DIAGNOSIS — Z79899 Other long term (current) drug therapy: Secondary | ICD-10-CM | POA: Diagnosis not present

## 2018-08-28 DIAGNOSIS — M5416 Radiculopathy, lumbar region: Secondary | ICD-10-CM | POA: Diagnosis not present

## 2018-08-28 DIAGNOSIS — Y92019 Unspecified place in single-family (private) house as the place of occurrence of the external cause: Secondary | ICD-10-CM | POA: Insufficient documentation

## 2018-08-28 LAB — POCT PREGNANCY, URINE: PREG TEST UR: NEGATIVE

## 2018-08-28 MED ORDER — KETOROLAC TROMETHAMINE 30 MG/ML IJ SOLN
30.0000 mg | Freq: Once | INTRAMUSCULAR | Status: AC
  Administered 2018-08-28: 30 mg via INTRAMUSCULAR
  Filled 2018-08-28: qty 1

## 2018-08-28 MED ORDER — ORPHENADRINE CITRATE 30 MG/ML IJ SOLN
60.0000 mg | Freq: Once | INTRAMUSCULAR | Status: AC
  Administered 2018-08-28: 60 mg via INTRAMUSCULAR
  Filled 2018-08-28: qty 2

## 2018-08-28 MED ORDER — METHOCARBAMOL 500 MG PO TABS: 500.0000 mg | ORAL_TABLET | Freq: Four times a day (QID) | ORAL | 0 refills | Status: DC

## 2018-08-28 MED ORDER — MELOXICAM 15 MG PO TABS: 15.0000 mg | ORAL_TABLET | Freq: Every day | ORAL | 0 refills | Status: DC

## 2018-08-28 NOTE — ED Triage Notes (Signed)
A&O, in wheelchair. States 30 minutes ago started L hip/back pain. Denies injury.

## 2018-08-28 NOTE — ED Provider Notes (Signed)
[facility] Emergency Department Provider Note  ____________________________________________  Time seen: Approximately 3:38 PM  I have reviewed the triage vital signs and the nursing notes.   HISTORY  Chief Complaint Back Pain and Hip Pain    HPI Katelyn Vazquez is a 23 y.o. female who presents the emergency department complaining of low back pain radiating into the left hip.  Patient reports that she bent over to pick up a towel on the floor, felt a pull sensation in her back, straighten up and had increased pain in her lower back rating into the left hip.  Patient denies any history of chronic back pain.  She does have a history of Ehlers-Danlos syndrome but has not been told that she has scoliosis or kyphosis.  Patient  reports that she does have issues with frequent hyperextension and dislocation of the joints.  Patient is able to walk appropriately.  She denies any numbness or tingling in her feet.  No saddle anesthesia, paresthesias.  No dysuria, polyuria, hematuria.  No bowel or bladder dysfunction.  No medications worse complaint prior to arrival.   Past Medical History:  Diagnosis Date  . Abdominal pain 05/04/2017  . Ehlers-Danlos syndrome   . POTS (postural orthostatic tachycardia syndrome)     Patient Active Problem List   Diagnosis Date Noted  . Abdominal pain 05/04/2017    Past Surgical History:  Procedure Laterality Date  . MOUTH SURGERY      Prior to Admission medications   Medication Sig Start Date End Date Taking? Authorizing Provider  ciprofloxacin (CIPRO) 250 MG tablet Take by mouth.    [provider]  diltiazem (DILACOR XR) 120 MG 24 hr capsule Take 120 mg by mouth daily.    [provider]  drospirenone-ethinyl estradiol (NIKKI) 3-0.02 MG tablet Take 1 tablet by mouth daily.    [provider]  Eyelid Cleansers (AVENOVA) 0.01 % SOLN Apply 1 application topically daily.    [provider]   fludrocortisone (FLORINEF) 0.1 MG tablet Take 0.2 mg by mouth daily.    [provider]  gabapentin (NEURONTIN) 100 MG capsule Take 100 mg by mouth 2 (two) times daily.    [provider]  gabapentin (NEURONTIN) 300 MG capsule Take 300 mg by mouth as needed.    [provider]  loratadine (CLARITIN) 10 MG tablet Take 10 mg by mouth daily.    [provider]  meloxicam (MOBIC) 15 MG tablet Take 1 tablet (15 mg total) by mouth daily. 08/28/18   Johnika Escareno, Delorise Royals, PA-C  methocarbamol (ROBAXIN) 500 MG tablet Take 1 tablet (500 mg total) by mouth 4 (four) times daily. 08/28/18   Venola Castello, Delorise Royals, PA-C  midodrine (PROAMATINE) 2.5 MG tablet Take 2.5 mg by mouth 3 (three) times daily with meals.    [provider]  Multiple Vitamin (MULTIVITAMIN WITH MINERALS) TABS tablet Take 1 tablet by mouth daily.    [provider]  Probiotic Product ([employer] ADV PROBIOTIC GUMMIES PO) Take 2 each by mouth daily.    [provider]  SUMAtriptan (IMITREX) 50 MG tablet Take 50 mg by mouth every 2 (two) hours as needed for migraine. May repeat in 2 hours if headache persists or recurs.    [provider]    Allergies Azithromycin  History reviewed. No pertinent family history.  Social History Social History   Tobacco Use  . Smoking status: Never Smoker  . Smokeless tobacco: Never Used  Substance Use Topics  .  Alcohol use: Yes    Comment: occasionally  . Drug use: No     Review of Systems  Constitutional: No fever/chills Eyes: No visual changes.  Cardiovascular: no chest pain. Respiratory: no cough. No SOB. Gastrointestinal: No abdominal pain.  No nausea, no vomiting.  No diarrhea.  No constipation. Genitourinary: Negative for dysuria. No hematuria Musculoskeletal: Positive for lower back pain radiating into the left hip Skin: Negative for rash, abrasions, lacerations, ecchymosis. Neurological: Negative for headaches, focal  weakness or numbness. 10-point ROS otherwise negative.  ____________________________________________   PHYSICAL EXAM:  VITAL SIGNS: ED Triage Vitals  Enc Vitals Group     BP 08/28/18 1451 125/79     Pulse Rate 08/28/18 1451 87     Resp 08/28/18 1451 16     Temp 08/28/18 1451 98.2 F (36.8 C)     Temp Source 08/28/18 1451 Oral     SpO2 08/28/18 1451 96 %     Weight 08/28/18 1451 148 lb (67.1 kg)     Height 08/28/18 1451 5\' 8"  (1.727 m)     Head Circumference --      Peak Flow --      Pain Score 08/28/18 1450 7     Pain Loc --      Pain Edu? --      Excl. in GC? --      Constitutional: Alert and oriented. Well appearing and in no acute distress. Eyes: Conjunctivae are normal. PERRL. EOMI. Head: Atraumatic. Neck: No stridor.    Cardiovascular: Normal rate, regular rhythm. Normal S1 and S2.  Good peripheral circulation. Respiratory: Normal respiratory effort without tachypnea or retractions. Lungs CTAB. Good air entry to the bases with no decreased or absent breath sounds. Gastrointestinal: Bowel sounds 4 quadrants. Soft and nontender to palpation. No guarding or rigidity. No palpable masses. No distention. No CVA tenderness. Musculoskeletal: Full range of motion to all extremities. No gross deformities appreciated.  Visualization of the lumbar spine reveals no visible abnormality or deformity.  Patient is minimally tender to palpation over the lumbar spine, diffusely tender palpation over the left paraspinal muscle group.  Mildly tender palpation of the left-sided sciatic notch.  Dorsalis pedis pulse intact bilateral lower extremities.  Sensation intact and equal bilateral lower extremities. Neurologic:  Normal speech and language. No gross focal neurologic deficits are appreciated.  Skin:  Skin is warm, dry and intact. No rash noted. Psychiatric: Mood and affect are normal. Speech and behavior are normal. Patient exhibits appropriate insight and  judgement.   ____________________________________________   LABS (all labs ordered are listed, but only abnormal results are displayed)  Labs Reviewed  POCT PREGNANCY, URINE   ____________________________________________  EKG   ____________________________________________  RADIOLOGY I personally viewed and evaluated these images as part of my medical decision making, as well as reviewing the written report by the radiologist.  I concur with radiologist finding  Dg Lumbar Spine 2-3 Views  Result Date: 08/28/2018 CLINICAL DATA:  Low back pain EXAM: LUMBAR SPINE - 2-3 VIEW COMPARISON:  None. FINDINGS: There is no evidence of lumbar spine fracture. Alignment is normal. Intervertebral disc spaces are maintained. IMPRESSION: Negative. Electronically Signed   By: Sherian Rein M.D.   On: 08/28/2018 16:32    ____________________________________________    PROCEDURES  Procedure(s) performed:    Procedures    Medications  ketorolac (TORADOL) 30 MG/ML injection 30 mg (30 mg Intramuscular Given 08/28/18 1749)  orphenadrine (NORFLEX) injection 60 mg (60 mg Intramuscular Given 08/28/18 1749)  ____________________________________________   INITIAL IMPRESSION / ASSESSMENT AND PLAN / ED COURSE  Pertinent labs & imaging results that were available during my care of the patient were reviewed by me and considered in my medical decision making (see chart for details).  Review of the [place] CSRS was performed in accordance of the [facility] prior to dispensing any controlled drugs.      Patient's diagnosis is consistent with lumbar radiculopathy from strain of the lumbar region.  Patient presents emergency department complaining of lower back pain radiating into the left hip.  Patient does have Ehlers-Danlos syndrome but does not report any known history of kyphosis, lordosis or scoliosis.  Given history, with low mechanism injury, x-ray was performed to further evaluate reviewed.  No acute  findings.  Patient was given Toradol and Norflex in the emergency department.  Meloxicam and Robaxin at home for symptom relief.  Follow-up primary care as needed.. Patient is given ED precautions to return to the ED for any worsening or new symptoms.     ____________________________________________  FINAL CLINICAL IMPRESSION(S) / ED DIAGNOSES  Final diagnoses:  Lumbar radiculopathy  Strain of lumbar region, initial encounter      NEW MEDICATIONS STARTED DURING THIS VISIT:  ED Discharge Orders         Ordered    meloxicam (MOBIC) 15 MG tablet  Daily     08/28/18 1732    methocarbamol (ROBAXIN) 500 MG tablet  4 times daily     08/28/18 1732              This chart was dictated using voice recognition software/Dragon. Despite best efforts to proofread, errors can occur which can change the meaning. Any change was purely unintentional.    Racheal PatchesCuthriell, Zharia Conrow D, PA-C 08/28/18 2245    Jeanmarie PlantMcShane, James A, MD 08/28/18 2340

## 2018-09-14 ENCOUNTER — Encounter: Payer: Self-pay | Admitting: Podiatry

## 2018-09-14 ENCOUNTER — Ambulatory Visit: Payer: BLUE CROSS/BLUE SHIELD | Admitting: Podiatry

## 2018-09-14 ENCOUNTER — Ambulatory Visit (INDEPENDENT_AMBULATORY_CARE_PROVIDER_SITE_OTHER): Payer: BLUE CROSS/BLUE SHIELD | Admitting: Internal Medicine

## 2018-09-14 DIAGNOSIS — B070 Plantar wart: Secondary | ICD-10-CM

## 2018-09-14 MED ORDER — FLUOROURACIL 5 % EX CREA: TOPICAL_CREAM | Freq: Two times a day (BID) | CUTANEOUS | 1 refills | Status: DC

## 2018-09-14 NOTE — Progress Notes (Signed)
Subjective:  Patient ID: Katelyn Vazquez, female    DOB: [birth year],  MRN: [id number] HPI Chief Complaint  Patient presents with  . Foot Pain    Plantar forefoot right - small, callused lesions x several months, history of plantar warts, tried Compound W, starting to hurt to walk  . New Patient (Initial Visit)    23 y.o. female presents with the above complaint.   ROS: Denies fever chills nausea vomiting muscle aches pains calf pain back pain chest pain shortness of breath.  Past Medical History:  Diagnosis Date  . Abdominal pain 05/04/2017  . Ehlers-Danlos syndrome   . POTS (postural orthostatic tachycardia syndrome)    Past Surgical History:  Procedure Laterality Date  . MOUTH SURGERY      Current Outpatient Medications:  .  dicyclomine (BENTYL) 20 MG tablet, , Disp: , Rfl:  .  diltiazem (CARDIZEM CD) 120 MG 24 hr capsule, , Disp: , Rfl:  .  Eyelid Cleansers (AVENOVA) 0.01 % SOLN, Apply 1 application topically daily., Disp: , Rfl:  .  fludrocortisone (FLORINEF) 0.1 MG tablet, Take 0.2 mg by mouth daily., Disp: , Rfl:  .  fluorouracil (EFUDEX) 5 % cream, Apply topically 2 (two) times daily., Disp: 40 g, Rfl: 1 .  gabapentin (NEURONTIN) 100 MG capsule, Take 100 mg by mouth 2 (two) times daily., Disp: , Rfl:  .  gabapentin (NEURONTIN) 300 MG capsule, Take 300 mg by mouth as needed., Disp: , Rfl:  .  Lactobacillus Rhamnosus, GG, (CULTURELLE) CAPS, Take by mouth., Disp: , Rfl:  .  Levonorgestrel 19.5 MG IUD, by Intrauterine route., Disp: , Rfl:  .  loratadine (CLARITIN) 10 MG tablet, Take 10 mg by mouth daily., Disp: , Rfl:  .  midodrine (PROAMATINE) 2.5 MG tablet, Take 2.5 mg by mouth 3 (three) times daily with meals., Disp: , Rfl:  .  Multiple Vitamin (MULTIVITAMIN WITH MINERALS) TABS tablet, Take 1 tablet by mouth daily., Disp: , Rfl:  .  Probiotic Product ([employer]S ADV PROBIOTIC GUMMIES PO), Take 2 each by mouth daily., Disp: , Rfl:  .  SUMAtriptan (IMITREX) 50 MG tablet, Take 50 mg by  mouth every 2 (two) hours as needed for migraine. May repeat in 2 hours if headache persists or recurs., Disp: , Rfl:   Allergies  Allergen Reactions  . Azithromycin Hives   Review of Systems Objective:  There were no vitals filed for this visit.  General: Well developed, nourished, in no acute distress, alert and oriented x3   Dermatological: Skin is warm, dry and supple bilateral. Nails x 10 are well maintained; remaining integument appears unremarkable at this time. There are no open sores, no preulcerative lesions, no rash or signs of infection present.  Small punctated porokeratotic lesions no thrombosed capillaries are visible skin lines do not circumvent the lesions.  This does not appear to be atypical wart though it could be a very primordial wart..  Vascular: Dorsalis Pedis artery and Posterior Tibial artery pedal pulses are 2/4 bilateral with immedate capillary fill time. Pedal hair growth present. No varicosities and no lower extremity edema present bilateral.   Neruologic: Grossly intact via light touch bilateral. Vibratory intact via tuning fork bilateral. Protective threshold with Semmes Wienstein monofilament intact to all pedal sites bilateral. Patellar and Achilles deep tendon reflexes 2+ bilateral. No Babinski or clonus noted bilateral.   Musculoskeletal: No gross boney pedal deformities bilateral. No pain, crepitus, or limitation noted with foot and ankle range of motion bilateral. Muscular strength 5/5 in  all groups tested bilateral.  Gait: Unassisted, Nonantalgic.    Radiographs:  None taken  Assessment & Plan:   Assessment: Poor keratomas versus verruca plantaris subsecond metatarsal right foot.  Plan: Applied Cantharone under occlusion to be washed off thoroughly tomorrow wrote a prescription for Efudex cream to be applied at least once daily until the skin becomes sore or 4 weeks.  Follow-up with her in 6 weeks.     Farrel Guimond T. Baker, [place]

## 2018-09-25 ENCOUNTER — Encounter: Payer: Self-pay | Admitting: Emergency Medicine

## 2018-09-25 ENCOUNTER — Ambulatory Visit
Admission: EM | Admit: 2018-09-25 | Discharge: 2018-09-25 | Disposition: A | Discharge status: Home / Self Care | Payer: BLUE CROSS/BLUE SHIELD | Attending: Emergency Medicine | Admitting: Emergency Medicine

## 2018-09-25 ENCOUNTER — Other Ambulatory Visit: Payer: Self-pay

## 2018-09-25 ENCOUNTER — Ambulatory Visit (INDEPENDENT_AMBULATORY_CARE_PROVIDER_SITE_OTHER): Payer: BLUE CROSS/BLUE SHIELD

## 2018-09-25 DIAGNOSIS — S93312A Subluxation of tarsal joint of left foot, initial encounter: Secondary | ICD-10-CM | POA: Diagnosis not present

## 2018-09-25 DIAGNOSIS — Z8739 Personal history of other diseases of the musculoskeletal system and connective tissue: Secondary | ICD-10-CM

## 2018-09-25 DIAGNOSIS — Y9302 Activity, running: Secondary | ICD-10-CM | POA: Diagnosis not present

## 2018-09-25 MED ORDER — MELOXICAM 15 MG PO TABS: 15.0000 mg | ORAL_TABLET | Freq: Every day | ORAL | 0 refills | Status: DC

## 2018-09-25 NOTE — Discharge Instructions (Addendum)
Avoid symptoms as much as possible.  Wear postop shoe for comfort.Apply ice 20 minutes out of every 2 hours 4-5 times daily for comfort.

## 2018-09-25 NOTE — ED Triage Notes (Signed)
Patient c/o left foot pain on Friday.  Patient states that she ran prior to the pain.  Patient denies injury or fall.

## 2018-09-25 NOTE — ED Provider Notes (Signed)
[facility]    CSN: [id number] Arrival date & time: 09/25/18  1109     History   Chief Complaint Chief Complaint  Patient presents with  . Foot Pain    left    HPI Katelyn Vazquez is a 23 y.o. female.   HPI  23 year old female presents with  left lateral foot pain that occurred on Friday.  She has a history of Ehlers-Danlos syndrome.  Had subluxation of her right shoulder and her right patella in the past.  Also has P OTS syndrome with syncope.  She ran on Thursday and on Friday while walking home she noticed the sudden onset of the pain of her left foot.  Incidentally she had noticed bruising over the dorsum of her foot mostly from the big toe laterally that she states was a yellowish-brown color.  It has since resolved.  She states that the pain is mostly when she bears weight and she feels it is quite localized over the base of the fifth metatarsal/tarsal area.  He is limping.  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           Past Medical History:  Diagnosis Date  . Abdominal pain 05/04/2017  . Ehlers-Danlos syndrome   . POTS (postural orthostatic tachycardia syndrome)     Patient Active Problem List   Diagnosis Date Noted  . Abdominal pain 05/04/2017  . POTS (postural orthostatic tachycardia syndrome) 01/18/2017  . Dizziness 10/05/2016  . Syncope 10/05/2016  . Ehlers-Danlos syndrome 10/03/2016    Past Surgical History:  Procedure Laterality Date  . MOUTH SURGERY      OB History   No obstetric history on file.      Home Medications    Prior to Admission medications   Medication Sig Start Date End Date Taking? Authorizing Provider  dicyclomine (BENTYL) 20 MG tablet  07/25/18  Yes [provider]  diltiazem (CARDIZEM CD) 120 MG 24 hr capsule  05/21/18  Yes [provider]  fludrocortisone (FLORINEF) 0.1 MG tablet Take 0.2 mg by mouth daily.   Yes [provider]  gabapentin  (NEURONTIN) 100 MG capsule Take 100 mg by mouth 2 (two) times daily.   Yes [provider]  gabapentin (NEURONTIN) 300 MG capsule Take 300 mg by mouth as needed.   Yes [provider]  Lactobacillus Rhamnosus, GG, (CULTURELLE) CAPS Take by mouth.   Yes [provider]  Levonorgestrel 19.5 MG IUD by Intrauterine route.   Yes [provider]  loratadine (CLARITIN) 10 MG tablet Take 10 mg by mouth daily.   Yes [provider]  midodrine (PROAMATINE) 2.5 MG tablet Take 2.5 mg by mouth 3 (three) times daily with meals.   Yes [provider]  Multiple Vitamin (MULTIVITAMIN WITH MINERALS) TABS tablet Take 1 tablet by mouth daily.   Yes [provider]  Probiotic Product ([employer]S ADV PROBIOTIC GUMMIES PO) Take 2 each by mouth daily.   Yes [provider]  SUMAtriptan (IMITREX) 50 MG tablet Take 50 mg by mouth every 2 (two) hours as needed for migraine. May repeat in 2 hours if headache persists or recurs.   Yes [provider]  Eyelid Cleansers (AVENOVA) 0.01 % SOLN Apply 1 application topically daily.    [provider]  fluorouracil (EFUDEX) 5 % cream Apply topically 2 (two) times daily. 09/14/18   Hyatt, Max T, DPM  meloxicam (MOBIC) 15 MG tablet Take 1 tablet (15  mg total) by mouth daily. 09/25/18   Lutricia Feil, PA-C    Family History History reviewed. No pertinent family history.  Social History Social History   Tobacco Use  . Smoking status: Never Smoker  . Smokeless tobacco: Never Used  Substance Use Topics  . Alcohol use: Yes    Comment: occasionally  . Drug use: No     Allergies   Azithromycin   Review of Systems Review of Systems  Constitutional: Positive for activity change. Negative for appetite change, chills, fatigue and fever.  Musculoskeletal: Positive for arthralgias and gait problem.  Skin: Negative for color change.  All other systems reviewed and are negative.    Physical  Exam Triage Vital Signs ED Triage Vitals  Enc Vitals Group     BP 09/25/18 1145 123/85     Pulse Rate 09/25/18 1145 78     Resp 09/25/18 1145 14     Temp 09/25/18 1145 98.4 F (36.9 C)     Temp Source 09/25/18 1145 Oral     SpO2 09/25/18 1145 100 %     Weight 09/25/18 1142 150 lb (68 kg)     Height 09/25/18 1142 5\' 8"  (1.727 m)     Head Circumference --      Peak Flow --      Pain Score 09/25/18 1142 7     Pain Loc --      Pain Edu? --      Excl. in GC? --    No data found.  Updated Vital Signs BP 123/85 (BP Location: Left Arm)   Pulse 78   Temp 98.4 F (36.9 C) (Oral)   Resp 14   Ht 5\' 8"  (1.727 m)   Wt 150 lb (68 kg)   SpO2 100%   BMI 22.81 kg/m   Visual Acuity Right Eye Distance:   Left Eye Distance:   Bilateral Distance:    Right Eye Near:   Left Eye Near:    Bilateral Near:     Physical Exam Vitals signs and nursing note reviewed.  Constitutional:      General: She is not in acute distress.    Appearance: Normal appearance. She is normal weight. She is not ill-appearing, toxic-appearing or diaphoretic.  HENT:     Head: Normocephalic and atraumatic.     Nose: Nose normal.     Mouth/Throat:     Mouth: Mucous membranes are moist.     Pharynx: Oropharynx is clear.  Eyes:     General:        Right eye: No discharge.        Left eye: No discharge.     Conjunctiva/sclera: Conjunctivae normal.  Musculoskeletal: Normal range of motion.        General: Tenderness and signs of injury present. No swelling or deformity.     Comments: Exam of the left foot does no swelling or bruising present.  She has good range of motion of the ankle and subtalar joint.  She has no tenderness over the midfoot or forefoot except for the base of the fifth metatarsal tarsal junction.  Peroneal is intact strong to clinical resistance.  Does have tenderness at this area but it is not excessive.  She states it is worse when she bears weight on the area.  Skin:    General: Skin is  warm and dry.     Findings: No bruising or erythema.  Neurological:     General: No focal deficit present.  Mental Status: She is alert and oriented to person, place, and time.  Psychiatric:        Mood and Affect: Mood normal.        Behavior: Behavior normal.        Thought Content: Thought content normal.        Judgment: Judgment normal.      UC Treatments / Results  Labs (all labs ordered are listed, but only abnormal results are displayed) Labs Reviewed - No data to display  EKG None  Radiology Dg Foot Complete Left  Result Date: 09/25/2018 CLINICAL DATA:  No known injury. Pt c/o pain lateral side of left foot since Friday. Hx of connective tissue dz.pain 5th MT base. H/O Ehlers danlos EXAM: LEFT FOOT - COMPLETE 3+ VIEW COMPARISON:  None. FINDINGS: No fracture or dislocation of mid foot or forefoot. The phalanges are normal. The calcaneus is normal. No soft tissue abnormality. IMPRESSION: No acute osseous abnormality. Electronically Signed   By: Genevive BiStewart  Edmunds M.D.   On: 09/25/2018 14:37    Procedures Procedures (including critical care time)  Medications Ordered in UC Medications - No data to display  Initial Impression / Assessment and Plan / UC Course  I have reviewed the triage vital signs and the nursing notes.  Pertinent labs & imaging results that were available during my care of the patient were reviewed by me and considered in my medical decision making (see chart for details).    I reviewed the x-rays with the patient.  No obvious fractures or dislocations.  SPECT she likely had a subluxation of the metatarsal tarsal joint at the base of the fifth.  Did run the day before she noticed the pain and states that she ran 3 to 4 miles on different surfaces.  I will place her in a postop shoe for better support and stability.  Avoid symptoms much as possible.  Use ice and elevation for comfort.  We will also place her on Mobic daily with food for pain control.   She is not improving she should follow-up with her orthopedic surgeon.  Final Clinical Impressions(s) / UC Diagnoses   Final diagnoses:  Subluxation of tarsal joint of left foot, initial encounter     Discharge Instructions     Avoid symptoms as much as possible.  Wear postop shoe for comfort.Apply ice 20 minutes out of every 2 hours 4-5 times daily for comfort.     ED Prescriptions    Medication Sig Dispense Auth. Provider   meloxicam (MOBIC) 15 MG tablet Take 1 tablet (15 mg total) by mouth daily. 30 tablet Lutricia Feiloemer, Shakevia Sarris P, PA-C     Controlled Substance Prescriptions [place] Controlled Substance Registry consulted? Not Applicable   Lutricia FeilRoemer, Harlon Kutner P, PA-C 09/25/18 1750

## 2018-10-04 ENCOUNTER — Encounter: Payer: Self-pay | Admitting: Podiatry

## 2018-10-04 ENCOUNTER — Ambulatory Visit: Payer: BLUE CROSS/BLUE SHIELD | Admitting: Podiatry

## 2018-10-04 DIAGNOSIS — M7672 Peroneal tendinitis, left leg: Secondary | ICD-10-CM

## 2018-10-04 MED ORDER — METHYLPREDNISOLONE 4 MG PO TBPK: ORAL_TABLET | ORAL | 0 refills | Status: DC

## 2018-10-07 NOTE — Progress Notes (Signed)
   HPI: 23 year old female presenting today with a chief complaint of pain to the left lateral heel that began 1-2 weeks ago. She reports associated bruising and swelling. She was seen at an urgent care and was told the X-Rays were negative and was given a post op shoe which she has been using with some relief. Walking without the post op shoe increases the pain. She denies any known trauma or injury. Patient is here for further evaluation and treatment.   Past Medical History:  Diagnosis Date  . Abdominal pain 05/04/2017  . Ehlers-Danlos syndrome   . POTS (postural orthostatic tachycardia syndrome)      Physical Exam: General: The patient is alert and oriented x3 in no acute distress.  Dermatology: Skin is warm, dry and supple bilateral lower extremities. Negative for open lesions or macerations.  Vascular: Palpable pedal pulses bilaterally. No edema or erythema noted. Capillary refill within normal limits.  Neurological: Epicritic and protective threshold grossly intact bilaterally.   Musculoskeletal Exam: Pain with palpation to the insertion of the peroneal tendon of the left foot. Range of motion within normal limits to all pedal and ankle joints bilateral. Muscle strength 5/5 in all groups bilateral.   Assessment: 1. Insertional peroneal tendinitis left    Plan of Care:  1. Patient evaluated. X-Rays in Epic from 09/25/2018 reviewed.  2. Compression anklet dispensed.  3. Recommended good shoe gear. Discontinue using post op shoe.  4. Prescription for Medrol Dose Pak provided to patient. Then continue taking Meloxicam.  5. Return to clinic as needed.      Felecia Shelling, DPM [facility]  Dr. Felecia Shelling, DPM    [address] [place], [place] [address]                Office [phone]  Fax [phone]

## 2018-11-02 ENCOUNTER — Ambulatory Visit: Payer: BLUE CROSS/BLUE SHIELD | Admitting: Podiatry

## 2019-02-21 ENCOUNTER — Other Ambulatory Visit: Payer: Self-pay

## 2019-02-21 ENCOUNTER — Encounter: Payer: Self-pay | Admitting: *Deleted

## 2019-02-24 ENCOUNTER — Other Ambulatory Visit: Payer: Self-pay | Admitting: Otolaryngology

## 2019-02-24 ENCOUNTER — Other Ambulatory Visit
Admission: RE | Admit: 2019-02-24 | Discharge: 2019-02-24 | Disposition: A | Discharge status: Home / Self Care | Payer: BLUE CROSS/BLUE SHIELD | Source: Ambulatory Visit | Attending: Otolaryngology | Admitting: Otolaryngology

## 2019-02-24 ENCOUNTER — Other Ambulatory Visit: Payer: Self-pay

## 2019-02-24 DIAGNOSIS — Z1159 Encounter for screening for other viral diseases: Secondary | ICD-10-CM | POA: Diagnosis present

## 2019-02-24 DIAGNOSIS — J3503 Chronic tonsillitis and adenoiditis: Secondary | ICD-10-CM

## 2019-02-24 LAB — SARS CORONAVIRUS 2 (TAT 6-24 HRS): SARS Coronavirus 2: NEGATIVE

## 2019-02-27 NOTE — Discharge Instructions (Signed)
T & A INSTRUCTION SHEET - [facility] °[facility] ° °P. SCOTT BENNETT, MD ° °[address] [place], [place] [address] TEL. [phone] °[address] [place] [address] [phone] ° °INFORMATION SHEET FOR A TONSILLECTOMY AND ADENDOIDECTOMY ° °About Your Tonsils and Adenoids °The tonsils and adenoids are normal body tissues that are part of our immune system. They normally help to protect us against diseases that may enter our mouth and nose. However, sometimes the tonsils and/or adenoids become too large and obstruct our breathing, especially at night. ° °If either of these things happen it helps to remove the tonsils and adenoids in order to become healthier. The operation to remove the tonsils and adenoids is called a tonsillectomy and adenoidectomy. ° °The Location of Your Tonsils and Adenoids °The tonsils are located in the back of the throat on both side and sit in a cradle of muscles. The adenoids are located in the roof of the mouth, behind the nose, and closely associated with the opening of the Eustachian tube to the ear. ° °Surgery on Tonsils and Adenoids °A tonsillectomy and adenoidectomy is a short operation which takes about thirty minutes. This includes being put to sleep and being awakened. Tonsillectomies and adenoidectomies are performed at [facility] and may require observation period in the recovery room prior to going home. Children are required to remain in the recovery area for 45 minutes after surgery. ° °Following the Operation for a Tonsillectomy °A cautery machine is used to control bleeding.  Bleeding from a tonsillectomy and adenoidectomy is minimal and postoperatively the risk of bleeding is approximately four percent, although this rarely life threatening. ° °After your tonsillectomy and adenoidectomy post-op care at home: °1. Our patients are able to go home the same day. You may be given prescriptions for  pain medications and antibiotics, if indicated. °2. It is extremely important to remember that fluid intake is of utmost importance after a tonsillectomy. The amount that you drink must be maintained in the postoperative period. A good indication of whether a child is getting enough fluid is whether his/her urine output is constant.  As long as children are urinating or wetting their diaper every 6 - 8 hours this is usually enough fluid intake.   °3. Although rare, this is a risk of some bleeding in the first ten days after surgery. This usually occurs between day five and nine postoperatively. This risk of bleeding is approximately four percent.  If you or your child should have any bleeding you should remain calm and notify our office or go directly to the Emergency Room at [facility] where they will contact us. Our doctors are available seven days a week for notification. We recommend sitting up quietly in a chair, place an ice pack on the front of the neck and spitting out the blood gently until we are able to contact you. Adults should gargle gently with ice water and this may help stop the bleeding. If the bleeding does not stop after a short time, i.e. 10 to 15 minutes, or seems to be increasing again, please contact us or go to the hospital.   °4. It is common for the pain to be worse at 5 - 7 days postoperatively. This occurs because the “scab” is peeling off and the mucous membrane (skin of the throat) is growing back where the tonsils were.   °5. It is common for a low-grade fever,   less than 102, during the first week after a tonsillectomy and adenoidectomy. It is usually due to not drinking enough liquids, and we suggest your use liquid Tylenol (acetaminophen) or the pain medicine with Tylenol (acetaminophen) prescribed in order to keep your temperature below 102. Please follow the directions on the back of the bottle. °6. Do not take aspirin or any products that contain aspirin  such as Bufferin, Anacin, Ecotrin, aspirin gum, Goodies, BC headache powders, etc., after a T&A because it can promote bleeding.  DO NOT TAKE MOTRIN OR IBUPROFEN. Please check with our office before administering any other medication that may been prescribed by other doctors during the two-week post-operative period. °7. If you happen to look in the mirror or into your child's mouth you will see white/gray patches on the back of the throat.  This is what a scab looks like in the mouth and is normal after having a tonsillectomy and adenoidectomy. It will disappear once the tonsil area heals completely. However, it may cause a noticeable odor, and this too will disappear with time.     °8. You or your child may experience ear pain after having a tonsillectomy and adenoidectomy. This is called referred pain and comes from the throat, but it is felt in the ears. Ear pain is quite common and expected. It will usually go away after ten days. There is usually nothing wrong with the ears, and it is primarily due to the healing area stimulating the nerve to the ear that runs along the side of the throat. Use either the prescribed pain medicine or Tylenol (acetaminophen) as needed.  °9. The throat tissues after a tonsillectomy are obviously sensitive. Smoking around children who have had a tonsillectomy significantly increases the risk of bleeding.  DO NOT SMOKE! °What to Expect Each Day  °First Day at Home °1. Patients will be discharged home the same day.  °2. Drink at least four glasses of liquid a day. Clear, cool liquids are recommended. Fruit juices containing citric acid are not recommended because they tend to cause pain. Carbonated beverages are allowed if you pour them from glass to glass to remove the bubbles as these tend to cause discomfort. Avoid alcoholic beverages.  °3. Eat very soft foods such as soups, broth, jello, custard, pudding, ice cream, popsicles, applesauce, mashed potatoes, and in general anything  that you can crush between your tongue and the roof of your mouth. Try adding Carnation Instant Breakfast Mix into your food for extra calories. It is not uncommon to lose 5 to 10 pounds of fluid weight. The weight will be gained back quickly once you're feeling better and drinking more.  °4. Sleep with your head elevated on two pillows for about three days to help decrease the swelling.  °5. DO NOT SMOKE!  °Day Two  °1. Rest as much as possible. Use common sense in your activities.  °2. Continue drinking at least four glasses of liquid per day.  °3. Follow the soft diet.  °4. Use your pain medication as needed.  °Day Three  °1. Advance your activity as you are able and continue to follow the previous day's suggestions.  °Days Four Through Six  °1. Advance your diet and begin to eat more solid foods such as chopped hamburger. °2. Advance your activities slowly. Children should be kept mostly around the house.  °3. Not uncommonly, there will be more pain at this time. It is temporary, usually lasting a day or two.  °Day Seven   Through Ten  °1. Most individuals by this time are able to return to work or school unless otherwise instructed. Consider sending children back to school for a half day on the first day back. ° ° °General Anesthesia, Adult, Care After °This sheet gives you information about how to care for yourself after your procedure. Your health care provider may also give you more specific instructions. If you have problems or questions, contact your health care provider. °What can I expect after the procedure? °After the procedure, the following side effects are common: °· Pain or discomfort at the IV site. °· Nausea. °· Vomiting. °· Sore throat. °· Trouble concentrating. °· Feeling cold or chills. °· Weak or tired. °· Sleepiness and fatigue. °· Soreness and body aches. These side effects can affect parts of the body that were not involved in surgery. °Follow these instructions at home: ° °For at least 24  hours after the procedure: °· Have a responsible adult stay with you. It is important to have someone help care for you until you are awake and alert. °· Rest as needed. °· Do not: °? Participate in activities in which you could fall or become injured. °? Drive. °? Use heavy machinery. °? Drink alcohol. °? Take sleeping pills or medicines that cause drowsiness. °? Make important decisions or sign legal documents. °? Take care of children on your own. °Eating and drinking °· Follow any instructions from your health care provider about eating or drinking restrictions. °· When you feel hungry, start by eating small amounts of foods that are soft and easy to digest (bland), such as toast. Gradually return to your regular diet. °· Drink enough fluid to keep your urine pale yellow. °· If you vomit, rehydrate by drinking water, juice, or clear broth. °General instructions °· If you have sleep apnea, surgery and certain medicines can increase your risk for breathing problems. Follow instructions from your health care provider about wearing your sleep device: °? Anytime you are sleeping, including during daytime naps. °? While taking prescription pain medicines, sleeping medicines, or medicines that make you drowsy. °· Return to your normal activities as told by your health care provider. Ask your health care provider what activities are safe for you. °· Take over-the-counter and prescription medicines only as told by your health care provider. °· If you smoke, do not smoke without supervision. °· Keep all follow-up visits as told by your health care provider. This is important. °Contact a health care provider if: °· You have nausea or vomiting that does not get better with medicine. °· You cannot eat or drink without vomiting. °· You have pain that does not get better with medicine. °· You are unable to pass urine. °· You develop a skin rash. °· You have a fever. °· You have redness around your IV site that gets worse. °Get  help right away if: °· You have difficulty breathing. °· You have chest pain. °· You have blood in your urine or stool, or you vomit blood. °Summary °· After the procedure, it is common to have a sore throat or nausea. It is also common to feel tired. °· Have a responsible adult stay with you for the first 24 hours after general anesthesia. It is important to have someone help care for you until you are awake and alert. °· When you feel hungry, start by eating small amounts of foods that are soft and easy to digest (bland), such as toast. Gradually return to your regular diet. °·   Drink enough fluid to keep your urine pale yellow. °· Return to your normal activities as told by your health care provider. Ask your health care provider what activities are safe for you. °This information is not intended to replace advice given to you by your health care provider. Make sure you discuss any questions you have with your health care provider. °Document Released: 11/02/2000 Document Revised: 07/30/2017 Document Reviewed: 03/12/2017 °Elsevier Patient Education © 2020 [employer] ° °

## 2019-02-28 ENCOUNTER — Ambulatory Visit: Payer: BLUE CROSS/BLUE SHIELD | Admitting: Anesthesiology

## 2019-02-28 ENCOUNTER — Encounter
Admission: RE | Disposition: A | Discharge status: Home / Self Care | Payer: Self-pay | Source: Home / Self Care | Attending: Otolaryngology

## 2019-02-28 ENCOUNTER — Other Ambulatory Visit: Payer: Self-pay

## 2019-02-28 ENCOUNTER — Ambulatory Visit
Admission: RE | Admit: 2019-02-28 | Discharge: 2019-02-28 | Disposition: A | Discharge status: Home / Self Care | Payer: BLUE CROSS/BLUE SHIELD | Attending: Otolaryngology | Admitting: Otolaryngology

## 2019-02-28 DIAGNOSIS — Z793 Long term (current) use of hormonal contraceptives: Secondary | ICD-10-CM | POA: Diagnosis not present

## 2019-02-28 DIAGNOSIS — Z79899 Other long term (current) drug therapy: Secondary | ICD-10-CM | POA: Insufficient documentation

## 2019-02-28 DIAGNOSIS — J3503 Chronic tonsillitis and adenoiditis: Secondary | ICD-10-CM | POA: Insufficient documentation

## 2019-02-28 DIAGNOSIS — Q796 Ehlers-Danlos syndrome, unspecified: Secondary | ICD-10-CM | POA: Insufficient documentation

## 2019-02-28 HISTORY — DX: Presence of spectacles and contact lenses: Z97.3

## 2019-02-28 HISTORY — DX: Migraine, unspecified, not intractable, without status migrainosus: G43.909

## 2019-02-28 HISTORY — PX: TONSILLECTOMY: SHX5217

## 2019-02-28 LAB — POCT PREGNANCY, URINE: Preg Test, Ur: NEGATIVE

## 2019-02-28 SURGERY — TONSILLECTOMY
Anesthesia: General | Site: Throat | Laterality: Bilateral

## 2019-02-28 MED ORDER — HYDROCODONE-ACETAMINOPHEN 7.5-325 MG/15ML PO SOLN: ORAL | 0 refills | Status: DC

## 2019-02-28 MED ORDER — LACTATED RINGERS IV SOLN
INTRAVENOUS | Status: DC
  Administered 2019-02-28: 08:00:00 via INTRAVENOUS

## 2019-02-28 MED ORDER — PREDNISOLONE SODIUM PHOSPHATE 15 MG/5ML PO SOLN: ORAL | 0 refills | Status: DC

## 2019-02-28 MED ORDER — FENTANYL CITRATE (PF) 100 MCG/2ML IJ SOLN: 25.0000 ug | INTRAMUSCULAR | Status: DC | PRN

## 2019-02-28 MED ORDER — LIDOCAINE HCL (CARDIAC) PF 100 MG/5ML IV SOSY
PREFILLED_SYRINGE | INTRAVENOUS | Status: DC | PRN
  Administered 2019-02-28: 50 mg via INTRAVENOUS

## 2019-02-28 MED ORDER — DEXAMETHASONE SODIUM PHOSPHATE 4 MG/ML IJ SOLN
INTRAMUSCULAR | Status: DC | PRN
  Administered 2019-02-28: 10 mg via INTRAVENOUS

## 2019-02-28 MED ORDER — ONDANSETRON HCL 4 MG/2ML IJ SOLN
INTRAMUSCULAR | Status: DC | PRN
  Administered 2019-02-28: 4 mg via INTRAVENOUS

## 2019-02-28 MED ORDER — ONDANSETRON HCL 4 MG/2ML IJ SOLN: 4.0000 mg | Freq: Once | INTRAMUSCULAR | Status: DC | PRN

## 2019-02-28 MED ORDER — ACETAMINOPHEN 10 MG/ML IV SOLN
1000.0000 mg | Freq: Once | INTRAVENOUS | Status: AC
  Administered 2019-02-28: 1000 mg via INTRAVENOUS

## 2019-02-28 MED ORDER — SCOPOLAMINE 1 MG/3DAYS TD PT72
1.0000 | MEDICATED_PATCH | TRANSDERMAL | Status: DC
  Administered 2019-02-28: 1.5 mg via TRANSDERMAL

## 2019-02-28 MED ORDER — GLYCOPYRROLATE 0.2 MG/ML IJ SOLN
INTRAMUSCULAR | Status: DC | PRN
  Administered 2019-02-28: 0.1 mg via INTRAVENOUS

## 2019-02-28 MED ORDER — SUCCINYLCHOLINE CHLORIDE 20 MG/ML IJ SOLN
INTRAMUSCULAR | Status: DC | PRN
  Administered 2019-02-28: 100 mg via INTRAVENOUS

## 2019-02-28 MED ORDER — PROPOFOL 10 MG/ML IV BOLUS
INTRAVENOUS | Status: DC | PRN
  Administered 2019-02-28: 150 mg via INTRAVENOUS

## 2019-02-28 MED ORDER — BUPIVACAINE-EPINEPHRINE 0.25% -1:200000 IJ SOLN
INTRAMUSCULAR | Status: DC | PRN
  Administered 2019-02-28: 3 mL

## 2019-02-28 MED ORDER — FENTANYL CITRATE (PF) 100 MCG/2ML IJ SOLN
INTRAMUSCULAR | Status: DC | PRN
  Administered 2019-02-28: 25 ug via INTRAVENOUS
  Administered 2019-02-28: 50 ug via INTRAVENOUS
  Administered 2019-02-28: 25 ug via INTRAVENOUS

## 2019-02-28 MED ORDER — OXYCODONE HCL 5 MG PO TABS: 5.0000 mg | ORAL_TABLET | Freq: Once | ORAL | Status: AC | PRN

## 2019-02-28 MED ORDER — OXYCODONE HCL 5 MG/5ML PO SOLN
5.0000 mg | Freq: Once | ORAL | Status: AC | PRN
  Administered 2019-02-28: 5 mg via ORAL

## 2019-02-28 MED ORDER — OXYMETAZOLINE HCL 0.05 % NA SOLN
NASAL | Status: DC | PRN
  Administered 2019-02-28: 1 via TOPICAL

## 2019-02-28 MED ORDER — MIDAZOLAM HCL 5 MG/5ML IJ SOLN
INTRAMUSCULAR | Status: DC | PRN
  Administered 2019-02-28: 2 mg via INTRAVENOUS

## 2019-02-28 SURGICAL SUPPLY — 16 items
BLADE BOVIE TIP EXT 4 (BLADE) ×3 IMPLANT
CANISTER SUCT 1200ML W/VALVE (MISCELLANEOUS) ×3 IMPLANT
CATH ROBINSON RED A/P 10FR (CATHETERS) ×3 IMPLANT
COAG SUCT 10F 3.5MM HAND CTRL (MISCELLANEOUS) ×3 IMPLANT
DECANTER SPIKE VIAL GLASS SM (MISCELLANEOUS) ×3 IMPLANT
ELECT REM PT RETURN 9FT ADLT (ELECTROSURGICAL) ×3
ELECTRODE REM PT RTRN 9FT ADLT (ELECTROSURGICAL) ×2 IMPLANT
GLOVE BIO SURGEON STRL SZ7.5 (GLOVE) ×3 IMPLANT
KIT TURNOVER KIT A (KITS) ×3 IMPLANT
NS IRRIG 500ML POUR BTL (IV SOLUTION) ×3 IMPLANT
PACK TONSIL AND ADENOID CUSTOM (PACKS) ×3 IMPLANT
PENCIL SMOKE EVACUATOR (MISCELLANEOUS) ×3 IMPLANT
SLEEVE SUCTION 125 (MISCELLANEOUS) ×3 IMPLANT
SOL ANTI-FOG 6CC FOG-OUT (MISCELLANEOUS) ×2 IMPLANT
SOL FOG-OUT ANTI-FOG 6CC (MISCELLANEOUS) ×1
SPONGE TONSIL .75 RFD DBL STRL (DISPOSABLE) ×1 IMPLANT

## 2019-02-28 NOTE — H&P (Signed)
History and physical reviewed and will be scanned in later. No change in medical status reported by the patient or family, appears stable for surgery. All questions regarding the procedure answered, and patient (or family if a child) expressed understanding of the procedure. ? ?Katelyn Vazquez S Amel Gianino ?@[email or web address]@ ?

## 2019-02-28 NOTE — Op Note (Signed)
02/28/2019  9:58 AM    Katelyn Vazquez  [id number]   Pre-Op Diagnosis:  Chronic Tonsillitis, Hypertrophy of tonsils  Post-op Diagnosis: Chronic Tonsillitis, Hypertrophy of tonsils  Procedure: Tonsillectomy  Surgeon:  Riley Nearing., MD  Anesthesia:  General endotracheal  EBL:  Less than 25 cc  Complications:  None  Findings: 2+ cryptic inflamed tonsils. No significant adenoid tissue  Procedure: The patient was taken to the Operating Room and placed in the supine position.  After induction of general endotracheal anesthesia, the table was turned 90 degrees and the patient was draped in the usual fashion  with the eyes protected.  A mouth gag was inserted into the oral cavity to open the mouth, and examination of the oropharynx showed the uvula was non-bifid. The palate was palpated, and there was no evidence of submucous bony cleft. Examination of the nasopharynx showed no obstructing adenoids. The right tonsil was grasped with an Allis clamp and resected from the tonsillar fossa in the usual fashion with the Bovie. The left tonsil was resected in the same fashion. The Bovie was used to obtain hemostasis. Each tonsillar fossa was then carefully injected with 0.25% marcaine , avoiding intravascular injection. The nose and throat were irrigated and suctioned to remove any  blood clot. The mouth gag was  removed with no evidence of active bleeding.  The patient was then returned to the anesthesiologist for awakening, and was taken to the Recovery Room in stable condition.  Cultures:  None.  Specimens:  Tonsils.  Disposition:   PACU to home  Plan: Soft, bland diet and push fluids. Take pain medications and prednisolone as prescribed. No strenuous activity for 2 weeks. Follow-up in 3 weeks.  Riley Nearing 02/28/2019 9:58 AM

## 2019-02-28 NOTE — Anesthesia Procedure Notes (Signed)
Procedure Name: Intubation Date/Time: 02/28/2019 9:31 AM Performed by: Mayme Genta, CRNA Pre-anesthesia Checklist: Patient identified, Emergency Drugs available, Suction available, Patient being monitored and Timeout performed Patient Re-evaluated:Patient Re-evaluated prior to induction Oxygen Delivery Method: Circle system utilized Preoxygenation: Pre-oxygenation with 100% oxygen Induction Type: IV induction Ventilation: Mask ventilation without difficulty Laryngoscope Size: Miller and 2 Grade View: Grade I Tube type: Oral Rae Tube size: 7.0 mm Number of attempts: 1 Placement Confirmation: ETT inserted through vocal cords under direct vision,  positive ETCO2 and breath sounds checked- equal and bilateral Tube secured with: Tape Dental Injury: Teeth and Oropharynx as per pre-operative assessment

## 2019-02-28 NOTE — Anesthesia Preprocedure Evaluation (Signed)
Anesthesia Evaluation  Patient identified by MRN, date of birth, ID band Patient awake    Reviewed: Allergy & Precautions, H&P , NPO status , Patient's Chart, lab work & pertinent test results, reviewed documented beta blocker date and time   Airway Mallampati: II  TM Distance: >3 FB Neck ROM: full    Dental no notable dental hx.    Pulmonary neg pulmonary ROS,    Pulmonary exam normal breath sounds clear to auscultation       Cardiovascular Exercise Tolerance: Good  Rhythm:regular Rate:Normal  POTS   Neuro/Psych negative neurological ROS  negative psych ROS   GI/Hepatic negative GI ROS, Neg liver ROS,   Endo/Other  negative endocrine ROS  Renal/GU negative Renal ROS  negative genitourinary   Musculoskeletal Ehlers-Danlos syndrome   Abdominal   Peds  Hematology negative hematology ROS (+)   Anesthesia Other Findings   Reproductive/Obstetrics negative OB ROS                             Anesthesia Physical Anesthesia Plan  ASA: II  Anesthesia Plan: General ETT   Post-op Pain Management:    Induction:   PONV Risk Score and Plan:   Airway Management Planned:   Additional Equipment:   Intra-op Plan:   Post-operative Plan:   Informed Consent: I have reviewed the patients History and Physical, chart, labs and discussed the procedure including the risks, benefits and alternatives for the proposed anesthesia with the patient or authorized representative who has indicated his/her understanding and acceptance.     Dental Advisory Given  Plan Discussed with: CRNA  Anesthesia Plan Comments:         Anesthesia Quick Evaluation

## 2019-02-28 NOTE — Transfer of Care (Signed)
Immediate Anesthesia Transfer of Care Note  Patient: Katelyn Vazquez  Procedure(s) Performed: TONSILLECTOMY (Bilateral Throat)  Patient Location: PACU  Anesthesia Type: General ETT  Level of Consciousness: awake, alert  and patient cooperative  Airway and Oxygen Therapy: Patient Spontanous Breathing and Patient connected to supplemental oxygen  Post-op Assessment: Post-op Vital signs reviewed, Patient's Cardiovascular Status Stable, Respiratory Function Stable, Patent Airway and No signs of Nausea or vomiting  Post-op Vital Signs: Reviewed and stable  Complications: No apparent anesthesia complications

## 2019-02-28 NOTE — Anesthesia Postprocedure Evaluation (Signed)
Anesthesia Post Note  Patient: Katelyn Vazquez  Procedure(s) Performed: TONSILLECTOMY (Bilateral Throat)  Patient location during evaluation: PACU Anesthesia Type: General Level of consciousness: awake and alert Pain management: pain level controlled Vital Signs Assessment: post-procedure vital signs reviewed and stable Respiratory status: spontaneous breathing, nonlabored ventilation, respiratory function stable and patient connected to nasal cannula oxygen Cardiovascular status: blood pressure returned to baseline and stable Postop Assessment: no apparent nausea or vomiting Anesthetic complications: no    Alisa Graff

## 2019-03-01 ENCOUNTER — Encounter: Payer: Self-pay | Admitting: Otolaryngology

## 2019-03-02 LAB — SURGICAL PATHOLOGY

## 2019-05-01 ENCOUNTER — Other Ambulatory Visit: Payer: Self-pay | Admitting: Family Medicine

## 2019-05-01 DIAGNOSIS — Z20822 Contact with and (suspected) exposure to covid-19: Secondary | ICD-10-CM

## 2019-05-03 LAB — NOVEL CORONAVIRUS, NAA: SARS-CoV-2, NAA: NOT DETECTED

## 2019-05-10 ENCOUNTER — Ambulatory Visit: Payer: BLUE CROSS/BLUE SHIELD

## 2019-05-10 ENCOUNTER — Ambulatory Visit: Payer: Self-pay | Admitting: Advanced Practice Midwife

## 2019-05-10 ENCOUNTER — Other Ambulatory Visit: Payer: Self-pay

## 2019-05-10 ENCOUNTER — Encounter: Payer: Self-pay | Admitting: Advanced Practice Midwife

## 2019-05-10 DIAGNOSIS — F339 Major depressive disorder, recurrent, unspecified: Secondary | ICD-10-CM | POA: Insufficient documentation

## 2019-05-10 DIAGNOSIS — Z113 Encounter for screening for infections with a predominantly sexual mode of transmission: Secondary | ICD-10-CM

## 2019-05-10 DIAGNOSIS — I951 Orthostatic hypotension: Secondary | ICD-10-CM

## 2019-05-10 DIAGNOSIS — R000 Tachycardia, unspecified: Secondary | ICD-10-CM

## 2019-05-10 DIAGNOSIS — Q796 Ehlers-Danlos syndrome, unspecified: Secondary | ICD-10-CM

## 2019-05-10 DIAGNOSIS — G90A Postural orthostatic tachycardia syndrome (POTS): Secondary | ICD-10-CM

## 2019-05-10 DIAGNOSIS — I498 Other specified cardiac arrhythmias: Secondary | ICD-10-CM

## 2019-05-10 DIAGNOSIS — F419 Anxiety disorder, unspecified: Secondary | ICD-10-CM

## 2019-05-10 LAB — WET PREP FOR TRICH, YEAST, CLUE: Trichomonas Exam: NEGATIVE

## 2019-05-10 NOTE — Progress Notes (Signed)
    STI clinic/screening visit  Subjective:  Katelyn Vazquez is a 23 y.o.nullip nonsmoker female being seen today for an STI screening visit. The patient reports they do have symptoms.  Patient has the following medical conditions:   Patient Active Problem List   Diagnosis Date Noted  . Depression, recurrent ([place]) dx'd 2020 05/10/2019  . Anxiety 05/10/2019  . Abdominal pain 05/04/2017  . POTS (postural orthostatic tachycardia syndrome) 01/18/2017  . Dizziness 10/05/2016  . Syncope 10/05/2016  . Ehlers-Danlos syndrome 10/03/2016     Chief Complaint  Patient presents with  . SEXUALLY TRANSMITTED DISEASE    HPI  Patient reports 5 "spots" after shaving on 05/04/19 that are almost healed.  Mirena inserted 03/2018.  Last ETOH 04/29/19 6 jello shots, 2 vodka lemonaides drinks 1-2x/wk.    See flowsheet for further details and programmatic requirements.    The following portions of the patient's history were reviewed and updated as appropriate: allergies, current medications, past medical history, past social history, past surgical history and problem list.  Objective:  There were no vitals filed for this visit.  Physical Exam Constitutional:      Appearance: Normal appearance. She is normal weight.  HENT:     Head: Normocephalic and atraumatic.     Nose: Nose normal.     Mouth/Throat:     Mouth: Mucous membranes are moist.  Eyes:     Conjunctiva/sclera: Conjunctivae normal.  Neck:     Musculoskeletal: Neck supple.  Pulmonary:     Effort: Pulmonary effort is normal.     Breath sounds: Normal breath sounds.  Abdominal:     Palpations: Abdomen is soft.     Comments: Good tone, soft without tenderness  Genitourinary:    Exam position: Lithotomy position.     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      Vagina: Vaginal discharge (yelllow thick ph<4.5, no c/o) present.     Cervix: No friability  or erythema.     Uterus: Normal.      Adnexa: Right adnexa normal and left adnexa normal.     Rectum: Normal.    Lymphadenopathy:     Lower Body: No right inguinal adenopathy. No left inguinal adenopathy.  Skin:    General: Skin is warm and dry.  Neurological:     Mental Status: She is alert.  Psychiatric:        Mood and Affect: Mood normal.       Assessment and Plan:  Katelyn Vazquez is a 23 y.o. female presenting to the [facility] Department for STI screening  1. Screening examination for venereal disease Treat wet mount per standing orders Immunization nurse consult - WET PREP FOR [place], YEAST, CLUE - Chlamydia/Gonorrhea [facility] - Syphilis Serology, [facility] - HIV  LAB - HSV NAA  2. POTS (postural orthostatic tachycardia syndrome) Taking Midodrine  3. Ehlers-Danlos syndrome Taking Gabapentin  4. Depression, recurrent ([place]) dx'd 2020 On Lexapro  5. Anxiety On Lexapro     Return if symptoms worsen or fail to improve.  No future appointments.  Herbie Saxon, CNM

## 2019-05-10 NOTE — Progress Notes (Addendum)
Here today for STD screening. Accepts bloodwork. Vora Clover, RN  Wet Prep results reviewed. Per standing orders no treatment indicated. Deloyce Walthers, RN   

## 2019-05-25 ENCOUNTER — Other Ambulatory Visit: Payer: Self-pay

## 2019-05-25 DIAGNOSIS — Z20822 Contact with and (suspected) exposure to covid-19: Secondary | ICD-10-CM

## 2019-05-26 LAB — NOVEL CORONAVIRUS, NAA: SARS-CoV-2, NAA: NOT DETECTED

## 2019-06-09 ENCOUNTER — Emergency Department: Payer: Commercial Managed Care - PPO

## 2019-06-09 ENCOUNTER — Other Ambulatory Visit: Payer: Self-pay

## 2019-06-09 ENCOUNTER — Emergency Department
Admission: EM | Admit: 2019-06-09 | Discharge: 2019-06-09 | Disposition: A | Discharge status: Home / Self Care | Payer: Commercial Managed Care - PPO | Attending: Emergency Medicine | Admitting: Emergency Medicine

## 2019-06-09 DIAGNOSIS — S5002XA Contusion of left elbow, initial encounter: Secondary | ICD-10-CM | POA: Insufficient documentation

## 2019-06-09 DIAGNOSIS — Y93I9 Activity, other involving external motion: Secondary | ICD-10-CM | POA: Insufficient documentation

## 2019-06-09 DIAGNOSIS — Y92414 Local residential or business street as the place of occurrence of the external cause: Secondary | ICD-10-CM | POA: Insufficient documentation

## 2019-06-09 DIAGNOSIS — S93492A Sprain of other ligament of left ankle, initial encounter: Secondary | ICD-10-CM | POA: Diagnosis not present

## 2019-06-09 DIAGNOSIS — S59902A Unspecified injury of left elbow, initial encounter: Secondary | ICD-10-CM | POA: Diagnosis present

## 2019-06-09 DIAGNOSIS — S93402A Sprain of unspecified ligament of left ankle, initial encounter: Secondary | ICD-10-CM

## 2019-06-09 DIAGNOSIS — Y999 Unspecified external cause status: Secondary | ICD-10-CM | POA: Diagnosis not present

## 2019-06-09 NOTE — Discharge Instructions (Addendum)
Follow-up see regular doctor as needed.  Follow-up orthopedics if not better in 1 week.  Take Tylenol and ibuprofen along with your gabapentin for pain.  Apply ice to all areas that hurt.
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
## 2019-06-13 ENCOUNTER — Other Ambulatory Visit: Payer: Self-pay

## 2019-06-13 DIAGNOSIS — Z20822 Contact with and (suspected) exposure to covid-19: Secondary | ICD-10-CM

## 2019-06-13 NOTE — Addendum Note (Signed)
Addended byAndrena Mews on: 06/13/2019 09:37 AM   Modules accepted: Orders

## 2019-06-14 LAB — NOVEL CORONAVIRUS, NAA: SARS-CoV-2, NAA: NOT DETECTED

## 2019-07-12 ENCOUNTER — Other Ambulatory Visit: Payer: Self-pay

## 2019-07-12 DIAGNOSIS — Z20822 Contact with and (suspected) exposure to covid-19: Secondary | ICD-10-CM

## 2019-07-15 LAB — NOVEL CORONAVIRUS, NAA: SARS-CoV-2, NAA: NOT DETECTED

## 2020-09-26 ENCOUNTER — Other Ambulatory Visit: Payer: Self-pay | Admitting: Family Medicine

## 2020-09-26 DIAGNOSIS — N644 Mastodynia: Secondary | ICD-10-CM

## 2020-09-26 DIAGNOSIS — N63 Unspecified lump in unspecified breast: Secondary | ICD-10-CM

## 2020-09-30 ENCOUNTER — Ambulatory Visit: Payer: Self-pay

## 2020-10-04 ENCOUNTER — Other Ambulatory Visit: Payer: Self-pay

## 2020-10-04 ENCOUNTER — Ambulatory Visit
Admission: RE | Admit: 2020-10-04 | Discharge: 2020-10-04 | Disposition: A | Discharge status: Home / Self Care | Payer: Commercial Managed Care - PPO | Source: Ambulatory Visit | Attending: Family Medicine | Admitting: Family Medicine

## 2020-10-04 DIAGNOSIS — N63 Unspecified lump in unspecified breast: Secondary | ICD-10-CM | POA: Insufficient documentation

## 2020-10-04 DIAGNOSIS — N644 Mastodynia: Secondary | ICD-10-CM | POA: Diagnosis not present

## 2021-04-30 ENCOUNTER — Other Ambulatory Visit: Payer: Self-pay

## 2021-04-30 ENCOUNTER — Ambulatory Visit: Payer: Self-pay

## 2021-04-30 DIAGNOSIS — Z23 Encounter for immunization: Secondary | ICD-10-CM

## 2021-09-23 ENCOUNTER — Ambulatory Visit: Payer: Self-pay | Admitting: Medical

## 2021-09-23 ENCOUNTER — Other Ambulatory Visit: Payer: Self-pay

## 2021-09-23 VITALS — BP 104/82 | HR 83 | Temp 98.2°F | Resp 16

## 2021-09-23 DIAGNOSIS — R300 Dysuria: Secondary | ICD-10-CM

## 2021-09-23 LAB — POCT URINALYSIS DIPSTICK
Bilirubin, UA: NEGATIVE
Blood, UA: NEGATIVE
Glucose, UA: NEGATIVE
Ketones, UA: NEGATIVE
Nitrite, UA: NEGATIVE
Protein, UA: NEGATIVE
Spec Grav, UA: <=1.005 — AB (ref 1.010–1.025)
Urobilinogen, UA: 0.2 E.U./dL
pH, UA: 5 (ref 5.0–8.0)

## 2021-09-23 MED ORDER — AMOXICILLIN-POT CLAVULANATE 875-125 MG PO TABS: 1.0000 | ORAL_TABLET | Freq: Two times a day (BID) | ORAL | 0 refills | Status: DC

## 2021-09-23 NOTE — Progress Notes (Signed)
Subjective:    Patient ID: Katelyn Vazquez, female    DOB: June 04, 1996, 26 y.o.   MRN: [id number]  HPI 26 yo female in non acute distress presents today with burning, frequency and urgency since last Thursday.Taking AZO and started on Macrobid by her PCP yesterday.  She took an Azo this am and a Macrobid pill.Has some lower abdominal pain and back pain.  She also has been sleeping more., symptoms worsened over weekend.   Review of Systems  Constitutional:  Positive for chills and fatigue (hx of fibromyalgia,  POTS, Ehlers-Danlos syndrome). Negative for fever.  Gastrointestinal:  Positive for abdominal pain (lower abdominal) and diarrhea.  Genitourinary:  Positive for decreased urine volume, dysuria, frequency and urgency. Negative for hematuria.  Musculoskeletal:  Positive for back pain.        Objective:   Physical Exam Constitutional:      Comments: Looks fatigued  HENT:     Head: Normocephalic and atraumatic.  Eyes:     Extraocular Movements: Extraocular movements intact.     Conjunctiva/sclera: Conjunctivae normal.     Pupils: Pupils are equal, round, and reactive to light.  Pulmonary:     Effort: Pulmonary effort is normal.  Abdominal:     General: There is no distension.     Palpations: There is no mass.     Tenderness: There is no abdominal tenderness. There is no right CVA tenderness, left CVA tenderness, guarding or rebound.     Hernia: No hernia is present.  Musculoskeletal:        General: Normal range of motion.  Skin:    General: Skin is warm and dry.  Neurological:     General: No focal deficit present.     Mental Status: She is alert and oriented to person, place, and time.  Psychiatric:        Mood and Affect: Mood normal.        Behavior: Behavior normal.        Thought Content: Thought content normal.        Judgment: Judgment normal.     Results for orders placed or performed in visit on 09/23/21 (from the past 24 hour(s))  POCT Urinalysis Dipstick      Status: Abnormal   Collection Time: 09/23/21  9:46 AM  Result Value Ref Range   Color, UA yellow    Clarity, UA clear    Glucose, UA Negative Negative   Bilirubin, UA negative    Ketones, UA negative    Spec Grav, UA <=1.005 (A) 1.010 - 1.025   Blood, UA negative    pH, UA 5.0 5.0 - 8.0   Protein, UA Negative Negative   Urobilinogen, UA 0.2 0.2 or 1.0 E.U./dL   Nitrite, UA Negative    Leukocytes, UA Moderate (2+) (A) Negative   Appearance     Odor          Assessment & Plan:  Dysuria Patient had already taken a Macrobid and Azo this am so UA results may be skewed. Will send off urine for C&S. Wrote patient  Meds ordered this encounter  Medications   amoxicillin-clavulanate (AUGMENTIN) 875-125 MG tablet    Sig: Take 1 tablet by mouth 2 (two) times daily. Take with food    Dispense:  20 tablet    Refill:  0   In case not improving in 2 days. Patient had UTI this summer and in January.  Its possible her UTI did not resolve. Which needed a PCN  antibiotic. So I wrote for her to have a PCN antibiotic. Hopefully culture will result shortly. Recommend a recheck of urine one week after finishing antibiotics. She made appointment for first week in March. She verbalizes understanding and has no questions at discharge.

## 2021-09-23 NOTE — Progress Notes (Deleted)
u

## 2021-09-26 LAB — URINE CULTURE

## 2021-10-07 ENCOUNTER — Other Ambulatory Visit: Payer: Self-pay

## 2021-10-07 ENCOUNTER — Encounter: Payer: Self-pay | Admitting: Medical

## 2021-10-07 ENCOUNTER — Ambulatory Visit: Payer: Self-pay | Admitting: Medical

## 2021-10-07 VITALS — BP 102/76 | HR 94 | Temp 98.2°F | Resp 16

## 2021-10-07 DIAGNOSIS — N3090 Cystitis, unspecified without hematuria: Secondary | ICD-10-CM

## 2021-10-07 DIAGNOSIS — R300 Dysuria: Secondary | ICD-10-CM

## 2021-10-07 LAB — POCT URINALYSIS DIPSTICK
Bilirubin, UA: NEGATIVE
Blood, UA: NEGATIVE
Glucose, UA: NEGATIVE
Ketones, UA: NEGATIVE
Nitrite, UA: NEGATIVE
Protein, UA: NEGATIVE
Spec Grav, UA: <=1.005 — AB (ref 1.010–1.025)
Urobilinogen, UA: 0.2 E.U./dL
pH, UA: 6 (ref 5.0–8.0)

## 2021-10-07 MED ORDER — AMOXICILLIN-POT CLAVULANATE 875-125 MG PO TABS: 1.0000 | ORAL_TABLET | Freq: Two times a day (BID) | ORAL | 0 refills | Status: DC

## 2021-10-07 NOTE — Progress Notes (Signed)
° °  Subjective:    Patient ID: Katelyn Vazquez, female    DOB: 1996/03/26, 26 y.o.   MRN: [id number]  HPI  26 yo female in non acute distress returns for urine dipstick recheck. She finished a 7 day course of Macrobid. She is asymptomatic today.  Review of Systems  Constitutional:  Negative for chills and fever.  Gastrointestinal:  Negative for abdominal pain.  Genitourinary:  Negative for dysuria.  Musculoskeletal:  Negative for back pain.      Objective:   Physical Exam Vitals and nursing note reviewed.  Constitutional:      Appearance: Normal appearance. She is normal weight.  HENT:     Head: Normocephalic and atraumatic.  Eyes:     Extraocular Movements: Extraocular movements intact.     Conjunctiva/sclera: Conjunctivae normal.     Pupils: Pupils are equal, round, and reactive to light.  Pulmonary:     Effort: Pulmonary effort is normal.  Skin:    General: Skin is warm and dry.  Neurological:     General: No focal deficit present.     Mental Status: She is alert and oriented to person, place, and time. Mental status is at baseline.  Psychiatric:        Mood and Affect: Mood normal.        Behavior: Behavior normal.        Thought Content: Thought content normal.        Judgment: Judgment normal.    Results for orders placed or performed in visit on 10/07/21 (from the past 24 hour(s))  POCT urinalysis dipstick     Status: Abnormal   Collection Time: 10/07/21  8:38 AM  Result Value Ref Range   Color, UA yellow    Clarity, UA clear    Glucose, UA Negative Negative   Bilirubin, UA Negative    Ketones, UA Negative    Spec Grav, UA <=1.005 (A) 1.010 - 1.025   Blood, UA Negative    pH, UA 6.0 5.0 - 8.0   Protein, UA Negative Negative   Urobilinogen, UA 0.2 0.2 or 1.0 E.U./dL   Nitrite, UA Negative    Leukocytes, UA Moderate (2+) (A) Negative   Appearance     Odor           Assessment & Plan:  Cystitis Moderate amount of leukocytes Meds ordered this encounter   Medications   amoxicillin-clavulanate (AUGMENTIN) 875-125 MG tablet    Sig: Take 1 tablet by mouth 2 (two) times daily. Take with food    Dispense:  20 tablet    Refill:  0   Recheck of urine after finishing antibiotics. To follow up with her OB/GYN, she has annual in 2 weeks.  Return to the clinic as needed.

## 2021-10-10 LAB — URINE CULTURE

## 2022-02-02 ENCOUNTER — Ambulatory Visit: Payer: BC Managed Care – PPO | Admitting: Medical

## 2022-02-02 ENCOUNTER — Encounter: Payer: Self-pay | Admitting: Medical

## 2022-02-02 VITALS — BP 110/74 | HR 103 | Temp 98.7°F | Wt 180.8 lb

## 2022-02-02 DIAGNOSIS — R102 Pelvic and perineal pain: Secondary | ICD-10-CM

## 2022-02-02 DIAGNOSIS — R350 Frequency of micturition: Secondary | ICD-10-CM

## 2022-02-02 MED ORDER — SULFAMETHOXAZOLE-TRIMETHOPRIM 800-160 MG PO TABS: 1.0000 | ORAL_TABLET | Freq: Two times a day (BID) | ORAL | 0 refills | Status: DC

## 2022-02-02 NOTE — Progress Notes (Signed)
   Subjective:    Patient ID: Katelyn Vazquez, female    DOB: Dec 09, 1995, 26 y.o.   MRN: [id number]  HPI 26 yo in non acute distress with lower abdominal discomfort/pain x 1 days. Denies fever or chills.   Review of Systems  Genitourinary:  Positive for frequency.       Suprapubic pain       Objective:   Physical Exam Vitals and nursing note reviewed.  Constitutional:      Appearance: Normal appearance.  HENT:     Head: Normocephalic and atraumatic.  Eyes:     Extraocular Movements: Extraocular movements intact.     Conjunctiva/sclera: Conjunctivae normal.     Pupils: Pupils are equal, round, and reactive to light.  Cardiovascular:     Rate and Rhythm: Normal rate and regular rhythm.  Abdominal:     General: Abdomen is flat. Bowel sounds are normal. There is no distension.     Palpations: Abdomen is soft. There is no mass.     Tenderness: There is abdominal tenderness (suprapubic). There is no right CVA tenderness, left CVA tenderness, guarding or rebound.     Hernia: No hernia is present.  Musculoskeletal:        General: Normal range of motion.     Cervical back: Normal range of motion and neck supple.  Skin:    General: Skin is warm and dry.  Neurological:     General: No focal deficit present.     Mental Status: She is alert and oriented to person, place, and time.  Psychiatric:        Mood and Affect: Mood normal.        Thought Content: Thought content normal.        Judgment: Judgment normal.    Recent Results (from the past 2160 hour(s))  Urine Culture     Status: None   Collection Time: 02/02/22  2:55 PM   Specimen: Urine   Urine  Result Value Ref Range   Urine Culture, Routine Final report    Organism ID, Bacteria Comment     Comment: Mixed urogenital flora Less than 10,000 colonies/mL   POCT urinalysis dipstick     Status: Abnormal   Collection Time: 02/04/22  7:43 AM  Result Value Ref Range   Color, UA yellow    Clarity, UA clear    Glucose, UA  Negative Negative   Bilirubin, UA Negative    Ketones, UA Negative    Spec Grav, UA 1.010 1.010 - 1.025   Blood, UA Trace    pH, UA 6.0 5.0 - 8.0   Protein, UA Positive (A) Negative   Urobilinogen, UA 0.2 0.2 or 1.0 E.U./dL   Nitrite, UA Negative    Leukocytes, UA Negative Negative   Appearance     Odor      Diagnosis  Suprapubic pain Meds ordered this encounter  Medications   DISCONTD: sulfamethoxazole-trimethoprim (BACTRIM DS) 800-160 MG tablet    Sig: Take 1 tablet by mouth 2 (two) times daily.    Dispense:  14 tablet    Refill:  0   Follow up with your PCP or OB/GYN for further evaluation. Patient verbalizes understanding and has no questions at discharge.

## 2022-02-03 ENCOUNTER — Other Ambulatory Visit: Payer: Self-pay | Admitting: Nurse Practitioner

## 2022-02-03 DIAGNOSIS — R102 Pelvic and perineal pain: Secondary | ICD-10-CM

## 2022-02-03 DIAGNOSIS — R350 Frequency of micturition: Secondary | ICD-10-CM

## 2022-02-03 MED ORDER — SULFAMETHOXAZOLE-TRIMETHOPRIM 800-160 MG PO TABS: 1.0000 | ORAL_TABLET | Freq: Two times a day (BID) | ORAL | 0 refills | Status: DC

## 2022-02-04 ENCOUNTER — Encounter: Payer: Self-pay | Admitting: Medical

## 2022-02-04 LAB — POCT URINALYSIS DIPSTICK
Bilirubin, UA: NEGATIVE
Glucose, UA: NEGATIVE
Ketones, UA: NEGATIVE
Leukocytes, UA: NEGATIVE
Nitrite, UA: NEGATIVE
Protein, UA: POSITIVE — AB
Spec Grav, UA: 1.01 (ref 1.010–1.025)
Urobilinogen, UA: 0.2 E.U./dL
pH, UA: 6 (ref 5.0–8.0)

## 2022-02-04 LAB — URINE CULTURE

## 2022-06-10 DIAGNOSIS — E041 Nontoxic single thyroid nodule: Secondary | ICD-10-CM

## 2022-06-10 HISTORY — DX: Nontoxic single thyroid nodule: E04.1

## 2022-07-01 HISTORY — PX: THYROIDECTOMY, PARTIAL: SHX18

## 2022-07-20 ENCOUNTER — Ambulatory Visit
Admission: EM | Admit: 2022-07-20 | Discharge: 2022-07-20 | Disposition: A | Discharge status: Home / Self Care | Payer: BC Managed Care – PPO | Attending: Emergency Medicine | Admitting: Emergency Medicine

## 2022-07-20 DIAGNOSIS — R1084 Generalized abdominal pain: Secondary | ICD-10-CM | POA: Insufficient documentation

## 2022-07-20 DIAGNOSIS — Z1152 Encounter for screening for COVID-19: Secondary | ICD-10-CM | POA: Diagnosis not present

## 2022-07-20 DIAGNOSIS — A084 Viral intestinal infection, unspecified: Secondary | ICD-10-CM | POA: Insufficient documentation

## 2022-07-20 DIAGNOSIS — B349 Viral infection, unspecified: Secondary | ICD-10-CM | POA: Diagnosis present

## 2022-07-20 DIAGNOSIS — Z79899 Other long term (current) drug therapy: Secondary | ICD-10-CM | POA: Diagnosis not present

## 2022-07-20 LAB — RESP PANEL BY RT-PCR (FLU A&B, COVID) ARPGX2
Influenza A by PCR: NEGATIVE
Influenza B by PCR: NEGATIVE
SARS Coronavirus 2 by RT PCR: NEGATIVE

## 2022-07-20 MED ORDER — ONDANSETRON 4 MG PO TBDP: 4.0000 mg | ORAL_TABLET | Freq: Three times a day (TID) | ORAL | 0 refills | Status: DC | PRN

## 2022-07-20 NOTE — Discharge Instructions (Addendum)
Your COVID and Flu tests are pending.    Take the antinausea medication as directed.  Keep yourself hydrated with clear liquids, such as water and Gatorade.    Follow up with your primary care provider if your symptoms are not improving.    Go to the emergency department if you have acute abdominal pain, are unable to keep yourself hydrated at home, or have other concerning symptoms.

## 2022-07-20 NOTE — ED Provider Notes (Signed)
[facility]    CSN: [id number] Arrival date & time: 07/20/22  [id number]      History   Chief Complaint Chief Complaint  Patient presents with   Emesis    HPI Katelyn Vazquez is a 26 y.o. female.  Patient presents with 1 day history of low-grade fever, nausea, vomiting, diarrhea, decreased appetite, abdominal pain, dizziness, body aches.  Tmax 100.9.  She describes the abdominal pain as intermittent sharp pain which resolves spontaneous after 1-2 seconds.  Treatment at home with Pepto Bismol.  She denies sore throat, cough, shortness of breath, rash, or other symptoms.  No recent travel or antibiotic use.  Patient had partial thyroidectomy on 07/01/2022.  She denies current pregnancy or breastfeeding.     The history is provided by the patient and medical records.    Past Medical History:  Diagnosis Date   Abdominal pain 05/04/2017   Ehlers-Danlos syndrome    Migraine headache    1-2x/month   POTS (postural orthostatic tachycardia syndrome)    Wears contact lenses     Patient Active Problem List   Diagnosis Date Noted   Depression, recurrent (HCC) dx'd 2020 05/10/2019   Anxiety 05/10/2019   Abdominal pain 05/04/2017   POTS (postural orthostatic tachycardia syndrome) 01/18/2017   Dizziness 10/05/2016   Syncope 10/05/2016   Ehlers-Danlos syndrome 10/03/2016    Past Surgical History:  Procedure Laterality Date   MOUTH SURGERY     THYROIDECTOMY, PARTIAL Left    TONSILLECTOMY Bilateral 02/28/2019   Procedure: TONSILLECTOMY;  Surgeon: Geanie LoganBennett, Paul, MD;  [facility];  Service: ENT;  Laterality: Bilateral;    OB History   No obstetric history on file.      Home Medications    Prior to Admission medications   Medication Sig Start Date End Date Taking? Authorizing Provider  BUSPIRONE HCL PO  06/10/22  Yes [provider]  DULoxetine (CYMBALTA) 60 MG capsule Take by mouth. 09/04/19  Yes [provider]  ondansetron (ZOFRAN-ODT) 4  MG disintegrating tablet Take 1 tablet (4 mg total) by mouth every 8 (eight) hours as needed for nausea or vomiting. 07/20/22  Yes Mickie Bailate, Alverta Caccamo H, NP  amoxicillin-clavulanate (AUGMENTIN) 875-125 MG tablet Take 1 tablet by mouth 2 (two) times daily. Take with food Patient not taking: Reported on 02/02/2022 10/07/21   Doy Minceatcliffe, Heather R, PA-C  Cholecalciferol (VITAMIN D3) 50 MCG (2000 UT) TABS Take by mouth daily.    [provider]  dicyclomine (BENTYL) 20 MG tablet  07/25/18   [provider]  diltiazem (CARDIZEM CD) 120 MG 24 hr capsule  05/21/18   [provider]  Eyelid Cleansers (AVENOVA) 0.01 % SOLN Apply 1 application topically daily. Patient not taking: Reported on 02/02/2022    [provider]  fludrocortisone (FLORINEF) 0.1 MG tablet Take 0.2 mg by mouth daily. Patient not taking: Reported on 02/02/2022    [provider]  fluticasone (FLONASE) 50 MCG/ACT nasal spray Place into both nostrils daily as needed for allergies or rhinitis.    [provider]  gabapentin (NEURONTIN) 100 MG capsule Take 100 mg by mouth 2 (two) times daily. Patient not taking: Reported on 02/02/2022    [provider]  gabapentin (NEURONTIN) 300 MG capsule Take 300 mg by mouth as needed. Patient not taking: Reported on 02/02/2022    [provider]  ketotifen (ZADITOR) 0.025 % ophthalmic solution 1 drop 2 (two) times daily as needed. Patient not taking: Reported on 02/02/2022  [provider]  Lactobacillus (ACIDOPHILUS PO) Take by mouth daily. Patient not taking: Reported on 02/02/2022    [provider]  levocetirizine (XYZAL) 5 MG tablet Take 5 mg by mouth daily. 03/03/20   [provider]  Levonorgestrel 19.5 MG IUD by Intrauterine route.    [provider]  loratadine (CLARITIN) 10 MG tablet Take 10 mg by mouth daily. Patient not taking: Reported on 02/02/2022    [provider]  methylphenidate  27 MG PO TB24 Take by mouth. 09/11/21   [provider]  midodrine (PROAMATINE) 2.5 MG tablet Take 2.5 mg by mouth 3 (three) times daily with meals. Patient not taking: Reported on 02/02/2022    [provider]  Multiple Vitamin (MULTIVITAMIN WITH MINERALS) TABS tablet Take 1 tablet by mouth daily.    [provider]  Multiple Vitamins-Minerals (AIRBORNE PO) Take by mouth daily. Patient not taking: Reported on 02/02/2022    [provider]  oral electrolytes (THERMOTABS) TABS tablet Take by mouth daily.    [provider]  sulfamethoxazole-trimethoprim (BACTRIM DS) 800-160 MG tablet Take 1 tablet by mouth 2 (two) times daily. 02/03/22   Viviano Simas, FNP  SUMAtriptan (IMITREX) 50 MG tablet Take 50 mg by mouth every 2 (two) hours as needed for migraine. May repeat in 2 hours if headache persists or recurs.    [provider]  tretinoin (RETIN-A) 0.025 % cream Apply topically at bedtime as needed.    [provider]  triamcinolone cream (KENALOG) 0.1 % Apply 1 application topically 2 (two) times daily as needed. Patient not taking: Reported on 02/02/2022    [provider]  vortioxetine HBr (TRINTELLIX) 10 MG TABS tablet Take by mouth. 11/16/19   [provider]    Family History No family history on file.  Social History Social History   Tobacco Use   Smoking status: Never   Smokeless tobacco: Never  Vaping Use   Vaping Use: Never used  Substance Use Topics   Alcohol use: Yes    Alcohol/week: 2.0 standard drinks of alcohol    Types: 2 Shots of liquor per week    Comment: daily   Drug use: Not Currently    Types: Marijuana     Allergies   Azithromycin   Review of Systems Review of Systems  Constitutional:  Positive for appetite change and fever. Negative for chills.  HENT:  Negative for ear pain and sore throat.   Respiratory:  Negative for cough and shortness of breath.   Cardiovascular:  Negative  for chest pain and palpitations.  Gastrointestinal:  Positive for abdominal pain, diarrhea, nausea and vomiting. Negative for blood in stool.  Genitourinary:  Negative for dysuria and hematuria.  Skin:  Negative for color change and rash.  All other systems reviewed and are negative.    Physical Exam Triage Vital Signs ED Triage Vitals  Enc Vitals Group     BP 07/20/22 1048 122/80     Pulse Rate 07/20/22 1048 (!) 109     Resp 07/20/22 1048 15     Temp 07/20/22 1048 98.6 F (37 C)     Temp src --      SpO2 07/20/22 1048 98 %     Weight 07/20/22 1044 185 lb (83.9 kg)     Height 07/20/22 1044 5\' 7"  (1.702 m)     Head Circumference --      Peak Flow --      Pain Score 07/20/22 1044 0  Pain Loc --      Pain Edu? --      Excl. in GC? --    No data found.  Updated Vital Signs BP 122/80   Pulse (!) 109   Temp 98.6 F (37 C)   Resp 15   Ht 5\' 7"  (1.702 m)   Wt 185 lb (83.9 kg)   SpO2 98%   BMI 28.98 kg/m   Visual Acuity Right Eye Distance:   Left Eye Distance:   Bilateral Distance:    Right Eye Near:   Left Eye Near:    Bilateral Near:     Physical Exam Vitals and nursing note reviewed.  Constitutional:      General: She is not in acute distress.    Appearance: Normal appearance. She is well-developed. She is not ill-appearing.  HENT:     Right Ear: Tympanic membrane normal.     Left Ear: Tympanic membrane normal.     Nose: Nose normal.     Mouth/Throat:     Mouth: Mucous membranes are moist.     Pharynx: Oropharynx is clear.  Cardiovascular:     Rate and Rhythm: Normal rate and regular rhythm.     Heart sounds: Normal heart sounds.  Pulmonary:     Effort: Pulmonary effort is normal. No respiratory distress.     Breath sounds: Normal breath sounds.  Abdominal:     General: Bowel sounds are normal. There is no distension.     Palpations: Abdomen is soft.     Tenderness: There is generalized abdominal tenderness. There is no guarding or rebound.   Musculoskeletal:     Cervical back: Neck supple.  Skin:    General: Skin is warm and dry.  Neurological:     Mental Status: She is alert.  Psychiatric:        Mood and Affect: Mood normal.        Behavior: Behavior normal.      UC Treatments / Results  Labs (all labs ordered are listed, but only abnormal results are displayed) Labs Reviewed  RESP PANEL BY RT-PCR (FLU A&B, COVID) [id number]    EKG   Radiology No results found.  Procedures Procedures (including critical care time)  Medications Ordered in UC Medications - No data to display  Initial Impression / Assessment and Plan / UC Course  I have reviewed the triage vital signs and the nursing notes.  Pertinent labs & imaging results that were available during my care of the patient were reviewed by me and considered in my medical decision making (see chart for details).   Viral gastroenteritis, viral illness, generalized abdominal pain.  COVID and flu pending.  Treating nausea and vomiting with Zofran.  Instructed patient to keep herself hydrated with clear liquids.  ED precautions discussed.  Education provided on gastroenteritis, viral illness, abdominal pain.  Patient to follow-up with her PCP if her symptoms are not improving.  She agrees to plan of care.   Final Clinical Impressions(s) / UC Diagnoses   Final diagnoses:  Viral gastroenteritis  Viral illness  Generalized abdominal pain     Discharge Instructions      Your COVID and Flu tests are pending.    Take the antinausea medication as directed.  Keep yourself hydrated with clear liquids, such as water and Gatorade.    Follow up with your primary care provider if your symptoms are not improving.    Go to the emergency department if you have acute abdominal pain,  are unable to keep yourself hydrated at home, or have other concerning symptoms.            ED Prescriptions     Medication Sig Dispense Auth. Provider   ondansetron (ZOFRAN-ODT)  4 MG disintegrating tablet Take 1 tablet (4 mg total) by mouth every 8 (eight) hours as needed for nausea or vomiting. 20 tablet Mickie Bail, NP      PDMP not reviewed this encounter.   Mickie Bail, NP 07/20/22 1124

## 2022-07-20 NOTE — ED Triage Notes (Addendum)
Patient to Urgent Care with complaints of poor appetite, nausea, dizziness, liquid diarrhea, and intermittent stomach pain. Symptoms started yesterday.  Reports that she vomited x1 a large amount of emesis and had generalized body aches.   Fever yesterday, low grade this morning (99.7). Max temp 100.9.

## 2022-08-10 HISTORY — PX: MOUTH SURGERY: SHX715

## 2023-05-26 ENCOUNTER — Encounter: Payer: Self-pay | Admitting: Physician Assistant

## 2023-05-26 ENCOUNTER — Other Ambulatory Visit: Payer: Self-pay

## 2023-05-26 ENCOUNTER — Ambulatory Visit: Payer: Self-pay | Admitting: Physician Assistant

## 2023-05-26 VITALS — BP 120/80 | HR 90 | Temp 99.5°F | Ht 67.0 in | Wt 190.0 lb

## 2023-05-26 DIAGNOSIS — G43909 Migraine, unspecified, not intractable, without status migrainosus: Secondary | ICD-10-CM

## 2023-05-26 DIAGNOSIS — R112 Nausea with vomiting, unspecified: Secondary | ICD-10-CM

## 2023-05-26 MED ORDER — ONDANSETRON 8 MG PO TBDP: 8.0000 mg | ORAL_TABLET | Freq: Three times a day (TID) | ORAL | 0 refills | Status: DC | PRN

## 2023-05-26 MED ORDER — ONDANSETRON 4 MG PO TBDP
4.0000 mg | ORAL_TABLET | Freq: Once | ORAL | Status: DC
Start: 2023-05-26 — End: 2023-11-02

## 2023-05-26 MED ORDER — KETOROLAC TROMETHAMINE 60 MG/2ML IM SOLN
60.0000 mg | Freq: Once | INTRAMUSCULAR | Status: AC
Start: 2023-05-26 — End: 2023-05-31

## 2023-05-26 NOTE — Progress Notes (Signed)
[facility] [address] [place], [place] [address]   Office Visit Note  Patient Name: Katelyn Vazquez Date of Birth [id number]  Medical Record number [id number]  Date of Service: 05/26/2023  Chief Complaint  Patient presents with   Acute Visit    Patient c/o headache with nausea and vomiting. Symptoms began about 4 hours ago. She states she has a hx of migraines but has never vomited from one before. She took Excedrin migraine but has not noticed any improvement in symptoms.     27 y/o F with h/o migraine c/o onset of migraine headache earlier today. Initially pain started in the posterior neck and now pain is in the face. Atypical location for migraine headache currently in the head. NO head or neck injuries recently. +feeling of nausea and vomiting earlier today. Took a friend's Excedrin medicine, but hasn't helped yet.       Current Medication:  Outpatient Encounter Medications as of 05/26/2023  Medication Sig   Ascorbic Acid (VITAMIN C) 500 MG CAPS Take by mouth daily.   BUSPIRONE HCL PO Take 5 mg by mouth 2 (two) times daily.   Cholecalciferol (VITAMIN D3) 50 MCG (2000 UT) TABS Take by mouth daily.   etodolac (LODINE) 400 MG tablet Take 400 mg by mouth 2 (two) times daily.   Eyelid Cleansers (AVENOVA) 0.01 % SOLN Apply 1 application  topically daily.   fluticasone (FLONASE) 50 MCG/ACT nasal spray Place into both nostrils daily as needed for allergies or rhinitis.   ketotifen (ZADITOR) 0.025 % ophthalmic solution 1 drop 2 (two) times daily as needed.   Lactobacillus (ACIDOPHILUS PO) Take by mouth daily.   levocetirizine (XYZAL) 5 MG tablet Take 5 mg by mouth daily.   Levonorgestrel 19.5 MG IUD by Intrauterine route.   methylphenidate (RITALIN) 10 MG tablet Take 10 mg by mouth daily.   methylphenidate 27 MG PO TB24 Take by mouth daily.   Multiple Vitamin (MULTIVITAMIN WITH MINERALS) TABS tablet Take 1 tablet by mouth daily.   ondansetron (ZOFRAN-ODT) 8 MG disintegrating  tablet Take 1 tablet (8 mg total) by mouth every 8 (eight) hours as needed for nausea or vomiting.   SUMAtriptan (IMITREX) 50 MG tablet Take 50 mg by mouth every 2 (two) hours as needed for migraine. May repeat in 2 hours if headache persists or recurs.   vortioxetine HBr (TRINTELLIX) 10 MG TABS tablet Take 2 tablets by mouth daily.   [DISCONTINUED] amoxicillin-clavulanate (AUGMENTIN) 875-125 MG tablet Take 1 tablet by mouth 2 (two) times daily. Take with food (Patient not taking: Reported on 02/02/2022)   [DISCONTINUED] dicyclomine (BENTYL) 20 MG tablet  (Patient not taking: Reported on 02/02/2022)   [DISCONTINUED] diltiazem (CARDIZEM CD) 120 MG 24 hr capsule  (Patient not taking: Reported on 02/02/2022)   [DISCONTINUED] DULoxetine (CYMBALTA) 60 MG capsule Take by mouth.   [DISCONTINUED] fludrocortisone (FLORINEF) 0.1 MG tablet Take 0.2 mg by mouth daily. (Patient not taking: Reported on 02/02/2022)   [DISCONTINUED] gabapentin (NEURONTIN) 100 MG capsule Take 100 mg by mouth 2 (two) times daily. (Patient not taking: Reported on 02/02/2022)   [DISCONTINUED] gabapentin (NEURONTIN) 300 MG capsule Take 300 mg by mouth as needed. (Patient not taking: Reported on 02/02/2022)   [DISCONTINUED] loratadine (CLARITIN) 10 MG tablet Take 10 mg by mouth daily. (Patient not taking: Reported on 02/02/2022)   [DISCONTINUED] midodrine (PROAMATINE) 2.5 MG tablet Take 2.5 mg by mouth 3 (three) times daily with meals. (Patient not taking: Reported on 02/02/2022)   [DISCONTINUED] Multiple  Vitamins-Minerals (AIRBORNE PO) Take by mouth daily. (Patient not taking: Reported on 02/02/2022)   [DISCONTINUED] ondansetron (ZOFRAN-ODT) 4 MG disintegrating tablet Take 1 tablet (4 mg total) by mouth every 8 (eight) hours as needed for nausea or vomiting.   [DISCONTINUED] oral electrolytes (THERMOTABS) TABS tablet Take by mouth daily.   [DISCONTINUED] sulfamethoxazole-trimethoprim (BACTRIM DS) 800-160 MG tablet Take 1 tablet by mouth 2 (two)  times daily.   [DISCONTINUED] tretinoin (RETIN-A) 0.025 % cream Apply topically at bedtime as needed.   [DISCONTINUED] triamcinolone cream (KENALOG) 0.1 % Apply 1 application topically 2 (two) times daily as needed. (Patient not taking: Reported on 02/02/2022)   Facility-Administered Encounter Medications as of 05/26/2023  Medication   ketorolac (TORADOL) injection 60 mg   ondansetron (ZOFRAN-ODT) disintegrating tablet 4 mg      Medical History: Past Medical History:  Diagnosis Date   Abdominal pain 05/04/2017   Ehlers-Danlos syndrome    Migraine headache    1-2x/month   POTS (postural orthostatic tachycardia syndrome)    Wears contact lenses      Vital Signs: BP 120/80   Pulse 90   Temp 99.5 F (37.5 C)   Ht 5\' 7"  (1.702 m)   Wt 190 lb (86.2 kg)   SpO2 97%   BMI 29.76 kg/m    Review of Systems  Constitutional: Negative.   Respiratory: Negative.    Cardiovascular: Negative.   Neurological:  Positive for headaches. Negative for tremors, speech difficulty, weakness and numbness.    Physical Exam Constitutional:      Appearance: Normal appearance.  HENT:     Head: Normocephalic and atraumatic.     Right Ear: External ear normal.     Left Ear: External ear normal.  Eyes:     Extraocular Movements: Extraocular movements intact.  Cardiovascular:     Rate and Rhythm: Normal rate and regular rhythm.  Pulmonary:     Effort: Pulmonary effort is normal.     Breath sounds: Normal breath sounds.  Skin:    General: Skin is warm and dry.  Neurological:     Mental Status: She is alert and oriented to person, place, and time.  Psychiatric:        Mood and Affect: Mood normal.        Behavior: Behavior normal.       Assessment/Plan:  1. Migraine without status migrainosus, not intractable, unspecified migraine type - ketorolac (TORADOL) injection 60 mg - ondansetron (ZOFRAN-ODT) disintegrating tablet 4 mg  2. Nausea and vomiting, unspecified vomiting type -  ondansetron (ZOFRAN-ODT) disintegrating tablet 4 mg - ondansetron (ZOFRAN-ODT) 8 MG disintegrating tablet; Take 1 tablet (8 mg total) by mouth every 8 (eight) hours as needed for nausea or vomiting.  Dispense: 15 tablet; Refill: 0  60mg  Toradol injection given for pain. No complications post injection. She also was given Ondasetron 4mg  at the clinic for nausea Take Ondansetron 8mg  medicine as prescribed. Pt given a work excuse to go home and stay in a dim lit room. Avoid noise and light exposure. Pt will contact us if her symptoms worsen. Go to [facility] clinic if symptoms don't improve or worsen. Pt verbalized understanding and in agreement.   General Counseling: Nailani verbalizes understanding of the findings of todays visit and agrees with plan of treatment. I have discussed any further diagnostic evaluation that may be needed or ordered today. We also reviewed her medications today. she has been encouraged to call the office with any questions or concerns that should arise related  to todays visit.    Time spent:20 Minutes    Gilberto Better, [place] Physician Assistant

## 2023-10-11 ENCOUNTER — Emergency Department (HOSPITAL_COMMUNITY)

## 2023-10-11 ENCOUNTER — Emergency Department (HOSPITAL_COMMUNITY)
Admission: EM | Admit: 2023-10-11 | Discharge: 2023-10-12 | Disposition: A | Discharge status: Home / Self Care | Attending: Emergency Medicine | Admitting: Emergency Medicine

## 2023-10-11 ENCOUNTER — Encounter (HOSPITAL_COMMUNITY): Payer: Self-pay

## 2023-10-11 ENCOUNTER — Other Ambulatory Visit: Payer: Self-pay

## 2023-10-11 DIAGNOSIS — I880 Nonspecific mesenteric lymphadenitis: Secondary | ICD-10-CM | POA: Insufficient documentation

## 2023-10-11 DIAGNOSIS — R1031 Right lower quadrant pain: Secondary | ICD-10-CM | POA: Diagnosis present

## 2023-10-11 LAB — URINALYSIS, ROUTINE W REFLEX MICROSCOPIC
Bilirubin Urine: NEGATIVE
Glucose, UA: NEGATIVE mg/dL
Ketones, ur: NEGATIVE mg/dL
Nitrite: NEGATIVE
Protein, ur: NEGATIVE mg/dL
Specific Gravity, Urine: 1.009 (ref 1.005–1.030)
pH: 7 (ref 5.0–8.0)

## 2023-10-11 LAB — CBC
HCT: 44.6 % (ref 36.0–46.0)
Hemoglobin: 15.2 g/dL — ABNORMAL HIGH (ref 12.0–15.0)
MCH: 29.8 pg (ref 26.0–34.0)
MCHC: 34.1 g/dL (ref 30.0–36.0)
MCV: 87.5 fL (ref 80.0–100.0)
Platelets: 258 10*3/uL (ref 150–400)
RBC: 5.1 MIL/uL (ref 3.87–5.11)
RDW: 11.8 % (ref 11.5–15.5)
WBC: 7.3 10*3/uL (ref 4.0–10.5)
nRBC: 0 % (ref 0.0–0.2)

## 2023-10-11 LAB — COMPREHENSIVE METABOLIC PANEL
ALT: 18 U/L (ref 0–44)
AST: 21 U/L (ref 15–41)
Albumin: 4.5 g/dL (ref 3.5–5.0)
Alkaline Phosphatase: 54 U/L (ref 38–126)
Anion gap: 11 (ref 5–15)
BUN: 8 mg/dL (ref 6–20)
CO2: 25 mmol/L (ref 22–32)
Calcium: 10 mg/dL (ref 8.9–10.3)
Chloride: 102 mmol/L (ref 98–111)
Creatinine, Ser: 0.8 mg/dL (ref 0.44–1.00)
GFR, Estimated: >60 mL/min (ref >60)
Glucose, Bld: 94 mg/dL (ref 70–99)
Potassium: 4.2 mmol/L (ref 3.5–5.1)
Sodium: 138 mmol/L (ref 135–145)
Total Bilirubin: 0.7 mg/dL (ref 0.0–1.2)
Total Protein: 7.5 g/dL (ref 6.5–8.1)

## 2023-10-11 LAB — LIPASE, BLOOD: Lipase: 34 U/L (ref 11–51)

## 2023-10-11 LAB — HCG, SERUM, QUALITATIVE: Preg, Serum: NEGATIVE

## 2023-10-11 MED ORDER — ACETAMINOPHEN 500 MG PO TABS
1000.0000 mg | ORAL_TABLET | Freq: Once | ORAL | Status: AC
  Administered 2023-10-11: 1000 mg via ORAL
  Filled 2023-10-11: qty 2

## 2023-10-11 MED ORDER — IOHEXOL 350 MG/ML SOLN
75.0000 mL | Freq: Once | INTRAVENOUS | Status: AC | PRN
  Administered 2023-10-11: 75 mL via INTRAVENOUS

## 2023-10-11 NOTE — ED Provider Notes (Signed)
 Care of patient assumed from [facility].  Patient presenting for intermittent right lower quadrant abdominal pain.  Ultrasound imaging was negative.  CT scan pending at this time. Physical Exam  BP 116/83   Pulse 69   Temp 97.8 F (36.6 C) (Oral)   Resp 15   Ht 5\' 7"  (1.702 m)   Wt 82.6 kg   SpO2 100%   BMI 28.51 kg/m   Physical Exam Vitals and nursing note reviewed.  Constitutional:      General: She is not in acute distress.    Appearance: She is well-developed. She is not ill-appearing.  HENT:     Head: Normocephalic and atraumatic.     Mouth/Throat:     Mouth: Mucous membranes are moist.  Eyes:     Extraocular Movements: Extraocular movements intact.     Conjunctiva/sclera: Conjunctivae normal.  Cardiovascular:     Rate and Rhythm: Normal rate and regular rhythm.  Pulmonary:     Effort: Pulmonary effort is normal. No respiratory distress.  Abdominal:     General: Abdomen is flat.     Palpations: Abdomen is soft.  Musculoskeletal:        General: No swelling.     Cervical back: Neck supple.  Skin:    General: Skin is warm and dry.  Neurological:     General: No focal deficit present.     Mental Status: She is alert and oriented to person, place, and time.  Psychiatric:        Mood and Affect: Mood normal.        Behavior: Behavior normal.     Procedures  Procedures  ED Course / MDM    Medical Decision Making Amount and/or Complexity of Data Reviewed Labs: ordered. Radiology: ordered.  Risk OTC drugs. Prescription drug management.   CT results still pending at time of signout.  Care of patient was signed to oncoming ED provider.       Gloris Manchester, MD 10/12/23 Jorje Guild

## 2023-10-11 NOTE — ED Provider Notes (Signed)
 11:58 PM Assumed care from Dr. Durwin Nora, please see their note for full history, physical and decision making until this point. In brief this is a 28 y.o. year old female who presented to the ED tonight with Abdominal Pain, Emesis, and Nausea     Appy rule out.   Discharge instructions, including strict return precautions for new or worsening symptoms, given. Patient and/or family verbalized understanding and agreement with the plan as described.   Labs, studies and imaging reviewed by myself and considered in medical decision making if ordered. Imaging interpreted by radiology.  Labs Reviewed  CBC - Abnormal; Notable for the following components:      Result Value   Hemoglobin 15.2 (*)    All other components within normal limits  URINALYSIS, ROUTINE W REFLEX MICROSCOPIC - Abnormal; Notable for the following components:   Color, Urine STRAW (*)    Hgb urine dipstick SMALL (*)    Leukocytes,Ua TRACE (*)    Bacteria, UA RARE (*)    All other components within normal limits  LIPASE, BLOOD  COMPREHENSIVE METABOLIC PANEL  HCG, SERUM, QUALITATIVE    US Pelvis Complete  Final Result    US Transvaginal Non-OB  Final Result    [place] Art/Ven Flow Abd Pelv Doppler  Final Result    CT ABDOMEN PELVIS W CONTRAST    (Results Pending)    No follow-ups on file.

## 2023-10-11 NOTE — ED Provider Notes (Incomplete)
 Care of patient assumed from [facility].  Patient presenting for intermittent right lower quadrant abdominal pain.  Ultrasound imaging was negative.  CT scan pending at this time. Physical Exam  BP 116/83   Pulse 69   Temp 97.8 F (36.6 C) (Oral)   Resp 15   Ht 5\' 7"  (1.702 m)   Wt 82.6 kg   SpO2 100%   BMI 28.51 kg/m   Physical Exam Vitals and nursing note reviewed.  Constitutional:      General: She is not in acute distress.    Appearance: She is well-developed. She is not ill-appearing.  HENT:     Head: Normocephalic and atraumatic.     Mouth/Throat:     Mouth: Mucous membranes are moist.  Eyes:     Extraocular Movements: Extraocular movements intact.     Conjunctiva/sclera: Conjunctivae normal.  Cardiovascular:     Rate and Rhythm: Normal rate and regular rhythm.  Pulmonary:     Effort: Pulmonary effort is normal. No respiratory distress.  Abdominal:     General: Abdomen is flat.     Palpations: Abdomen is soft.  Musculoskeletal:        General: No swelling.     Cervical back: Neck supple.  Skin:    General: Skin is warm and dry.  Neurological:     General: No focal deficit present.     Mental Status: She is alert and oriented to person, place, and time.  Psychiatric:        Mood and Affect: Mood normal.        Behavior: Behavior normal.     Procedures  Procedures  ED Course / MDM    Medical Decision Making Amount and/or Complexity of Data Reviewed Labs: ordered. Radiology: ordered.  Risk OTC drugs. Prescription drug management.   ***

## 2023-10-11 NOTE — ED Provider Notes (Signed)
 [place] EMERGENCY DEPARTMENT AT [facility] Provider Note   CSN: [id number] Arrival date & time: 10/11/23  1048     History  Chief Complaint  Patient presents with   Abdominal Pain   Emesis   Nausea    Katelyn Vazquez is a 28 y.o. female who presents with right lower quadrant abdominal pain.  This has been ongoing for the past few months.  However this morning she woke up from sleep with notable worsening of pain in her right lower quadrant.  She does have a history of right ovarian cyst.  Does report episode of emesis earlier this morning.  No diarrhea.  No reported fevers or chills.  She is not coughing or feeling congested.  No flank pain.  No history of kidney stones.  Abdominal Pain Associated symptoms: vomiting   Emesis Associated symptoms: abdominal pain       Past Medical History:  Diagnosis Date   Abdominal pain 05/04/2017   Ehlers-Danlos syndrome    Migraine headache    1-2x/month   POTS (postural orthostatic tachycardia syndrome)    Wears contact lenses      Home Medications Prior to Admission medications   Medication Sig Start Date End Date Taking? Authorizing Provider  Bacillus Coagulans-Inulin (PROBIOTIC-PREBIOTIC PO) Take 1 each by mouth daily. Ollie Pre/Probiotic gummy   Yes [provider]  dexmethylphenidate (FOCALIN XR) 10 MG 24 hr capsule Take 10 mg by mouth daily.   Yes [provider]  Levonorgestrel 19.5 MG IUD by Intrauterine route.   Yes [provider]  methocarbamol (ROBAXIN) 500 MG tablet Take 500 mg by mouth every 8 (eight) hours as needed for muscle spasms.   Yes [provider]  Multiple Vitamins-Minerals (MULTIVITAMIN GUMMIES WOMENS) CHEW Chew 2 each by mouth daily. Ollie Women's Gummy Multivitamin   Yes [provider]  ondansetron (ZOFRAN) 4 MG tablet Take 4 mg by mouth daily as needed for nausea or vomiting.   Yes [provider]  OVER THE COUNTER MEDICATION Take 4 capsules  by mouth at bedtime. Myo Inisitol   Yes [provider]  SUMAtriptan (IMITREX) 50 MG tablet Take 50 mg by mouth See admin instructions. Take 1 tablet (50mg ) as needed for migraine. May repeat dose once if headache persists or recurs. D   Yes [provider]  vortioxetine HBr (TRINTELLIX) 20 MG TABS tablet Take 20 mg by mouth at bedtime.   Yes [provider]  Multiple Vitamin (MULTIVITAMIN WITH MINERALS) TABS tablet Take 1 tablet by mouth daily.    [provider]      Allergies    Zithromax [azithromycin]    Review of Systems   Review of Systems  Gastrointestinal:  Positive for abdominal pain and vomiting.    Physical Exam Updated Vital Signs BP 116/83   Pulse 69   Temp 97.8 F (36.6 C) (Oral)   Resp 15   Ht 5\' 7"  (1.702 m)   Wt 82.6 kg   SpO2 100%   BMI 28.51 kg/m  Physical Exam Vitals and nursing note reviewed.  Constitutional:      General: She is not in acute distress.    Appearance: She is well-developed.  HENT:     Head: Normocephalic and atraumatic.  Eyes:     Conjunctiva/sclera: Conjunctivae normal.  Cardiovascular:     Rate and Rhythm: Normal rate and regular rhythm.     Heart sounds: No murmur heard. Pulmonary:     Effort: Pulmonary effort  is normal. No respiratory distress.     Breath sounds: Normal breath sounds.  Abdominal:     Palpations: Abdomen is soft.     Tenderness: There is no abdominal tenderness.     Comments: Suprapubic and lower RLQ tenderness, no rebound, guarding or peritoneal signs.  No CVAT  Musculoskeletal:        General: No swelling.     Cervical back: Neck supple.  Skin:    General: Skin is warm and dry.     Capillary Refill: Capillary refill takes less than 2 seconds.  Neurological:     Mental Status: She is alert.  Psychiatric:        Mood and Affect: Mood normal.     ED Results / Procedures / Treatments   Labs (all labs ordered are listed, but only abnormal results are displayed) Labs  Reviewed  CBC - Abnormal; Notable for the following components:      Result Value   Hemoglobin 15.2 (*)    All other components within normal limits  URINALYSIS, ROUTINE W REFLEX MICROSCOPIC - Abnormal; Notable for the following components:   Color, Urine STRAW (*)    Hgb urine dipstick SMALL (*)    Leukocytes,Ua TRACE (*)    Bacteria, UA RARE (*)    All other components within normal limits  LIPASE, BLOOD  COMPREHENSIVE METABOLIC PANEL  HCG, SERUM, QUALITATIVE    EKG None  Radiology US Pelvis Complete Result Date: 10/11/2023 CLINICAL DATA:  Right lower quadrant pain. History of ovarian cyst. IUD for 2 years. Last menstrual period 09/07/2023. EXAM: TRANSABDOMINAL AND TRANSVAGINAL ULTRASOUND OF PELVIS DOPPLER ULTRASOUND OF OVARIES TECHNIQUE: Both transabdominal and transvaginal ultrasound examinations of the pelvis were performed. Transabdominal technique was performed for global imaging of the pelvis including uterus, ovaries, adnexal regions, and pelvic cul-de-sac. It was necessary to proceed with endovaginal exam following the transabdominal exam to visualize the the endometrial stripe and bilateral adnexae. Color and duplex Doppler ultrasound was utilized to evaluate blood flow to the ovaries. COMPARISON:  CT abdomen and pelvis 05/21/2017 FINDINGS: Uterus Measurements: 6.2 x 2.5 x 4.0 cm = volume: 33 mL. No fibroids or other mass visualized. Endometrium Thickness: 3 mm, within normal limits. IUD noted in appropriate position within the anterior canal extending to the uterine fundus. Right ovary Measurements: 4.1 x 2.3 x 2.5 cm = volume: 13 mL. Normal appearance/no adnexal mass. Left ovary Measurements: 4.1 x 2.2 x 2.3 cm = volume: 11 mL. Normal appearance/no adnexal mass. Normal physiologic follicles are seen within the bilateral ovaries. Pulsed Doppler evaluation of both ovaries demonstrates normal low-resistance arterial and venous waveforms. Other findings No abnormal free fluid.  IMPRESSION: 1. Normal pelvic ultrasound. No ultrasound evidence of ovarian torsion. 2. IUD in appropriate position within the anterior canal extending to the uterine fundus. Electronically Signed   By: Neita Garnet M.D.   On: 10/11/2023 20:50   US Transvaginal Non-OB Result Date: 10/11/2023 CLINICAL DATA:  Right lower quadrant pain. History of ovarian cyst. IUD for 2 years. Last menstrual period 09/07/2023. EXAM: TRANSABDOMINAL AND TRANSVAGINAL ULTRASOUND OF PELVIS DOPPLER ULTRASOUND OF OVARIES TECHNIQUE: Both transabdominal and transvaginal ultrasound examinations of the pelvis were performed. Transabdominal technique was performed for global imaging of the pelvis including uterus, ovaries, adnexal regions, and pelvic cul-de-sac. It was necessary to proceed with endovaginal exam following the transabdominal exam to visualize the the endometrial stripe and bilateral adnexae. Color and duplex Doppler ultrasound was utilized to evaluate blood flow to the ovaries. COMPARISON:  CT abdomen and pelvis 05/21/2017 FINDINGS: Uterus Measurements: 6.2 x 2.5 x 4.0 cm = volume: 33 mL. No fibroids or other mass visualized. Endometrium Thickness: 3 mm, within normal limits. IUD noted in appropriate position within the anterior canal extending to the uterine fundus. Right ovary Measurements: 4.1 x 2.3 x 2.5 cm = volume: 13 mL. Normal appearance/no adnexal mass. Left ovary Measurements: 4.1 x 2.2 x 2.3 cm = volume: 11 mL. Normal appearance/no adnexal mass. Normal physiologic follicles are seen within the bilateral ovaries. Pulsed Doppler evaluation of both ovaries demonstrates normal low-resistance arterial and venous waveforms. Other findings No abnormal free fluid. IMPRESSION: 1. Normal pelvic ultrasound. No ultrasound evidence of ovarian torsion. 2. IUD in appropriate position within the anterior canal extending to the uterine fundus. Electronically Signed   By: Neita Garnet M.D.   On: 10/11/2023 20:50   [place] Art/Ven Flow Abd  Pelv Doppler Result Date: 10/11/2023 CLINICAL DATA:  Right lower quadrant pain. History of ovarian cyst. IUD for 2 years. Last menstrual period 09/07/2023. EXAM: TRANSABDOMINAL AND TRANSVAGINAL ULTRASOUND OF PELVIS DOPPLER ULTRASOUND OF OVARIES TECHNIQUE: Both transabdominal and transvaginal ultrasound examinations of the pelvis were performed. Transabdominal technique was performed for global imaging of the pelvis including uterus, ovaries, adnexal regions, and pelvic cul-de-sac. It was necessary to proceed with endovaginal exam following the transabdominal exam to visualize the the endometrial stripe and bilateral adnexae. Color and duplex Doppler ultrasound was utilized to evaluate blood flow to the ovaries. COMPARISON:  CT abdomen and pelvis 05/21/2017 FINDINGS: Uterus Measurements: 6.2 x 2.5 x 4.0 cm = volume: 33 mL. No fibroids or other mass visualized. Endometrium Thickness: 3 mm, within normal limits. IUD noted in appropriate position within the anterior canal extending to the uterine fundus. Right ovary Measurements: 4.1 x 2.3 x 2.5 cm = volume: 13 mL. Normal appearance/no adnexal mass. Left ovary Measurements: 4.1 x 2.2 x 2.3 cm = volume: 11 mL. Normal appearance/no adnexal mass. Normal physiologic follicles are seen within the bilateral ovaries. Pulsed Doppler evaluation of both ovaries demonstrates normal low-resistance arterial and venous waveforms. Other findings No abnormal free fluid. IMPRESSION: 1. Normal pelvic ultrasound. No ultrasound evidence of ovarian torsion. 2. IUD in appropriate position within the anterior canal extending to the uterine fundus. Electronically Signed   By: Neita Garnet M.D.   On: 10/11/2023 20:50    Procedures Procedures    Medications Ordered in ED Medications  acetaminophen (TYLENOL) tablet 1,000 mg (1,000 mg Oral Given 10/11/23 2059)    ED Course/ Medical Decision Making/ A&P                                 Medical Decision Making Amount and/or Complexity  of Data Reviewed Labs: ordered. Radiology: ordered.  Risk OTC drugs.   This patient presents to the ED with chief complaint(s) of abdominal pain.  The complaint involves an extensive differential diagnosis and also carries with it a high risk of complications and morbidity.   pertinent past medical history as listed in HPI  The differential diagnosis includes  gastroenteritis, colitis, small bowel obstruction, appendicitis, cholecystitis, hepatobiliary pathology, gastritis, PUD, diverticulosis/diverticulitis, pancreatitis, nephrolithiasis,  UTI, pyelonephritis,  PID, ovarian torsion.  The initial plan is to  Will start with basic labs Additional history obtained: Records reviewed Care Everywhere/External Records  Initial Assessment:   Hemodynamically stable, afebrile, nontoxic-appearing patient presenting with right lower quadrant and suprapubic abdominal pain has been intermittent over the  past few months but worsened this morning.  She does have a history of right ovarian cyst.  On exam she has right lower quadrant tenderness without rebound or guarding.  Given that symptoms have been ongoing for the past month, she has no leukocytosis and is afebrile.  Lower suspicion for appendicitis.  She has no urinary symptoms to suggest cystitis or pyelonephritis, no flank pain to suggest nephrolithiasis.  Overall most concerning for right ovarian cyst versus torsion.  Will obtain ultrasound.  She denies interest and pain medication at this time  Independent ECG interpretation:  None  Independent labs interpretation:  The following labs were independently interpreted:  UA with small hemoglobin, trace leukocytes, negative nitrites, lipase within normal limits, CMP unremarkable, hCG negative, CBC without leukocytosis or anemia  Independent visualization and interpretation of imaging: I independently visualized the following imaging with scope of interpretation limited to determining acute life  threatening conditions related to emergency care: Transvaginal/pelvic ultrasound, which revealed no acute findings CT abdomen pelvis pending Treatment and Reassessment: Patient declined something for pain following first assessment  Following second assessment patient requested something light for pain.  Given Tylenol 1000 mg  Discussed negative ultrasound findings with patient.  Is shared decision making agreed to proceed with CT abdomen pelvis.  Consultations obtained:   none  Disposition:   Signout given to Dr. Durwin Nora. Disposition pending discharge.   Social Determinants of Health:   none  This note was dictated with voice recognition software.  Despite best efforts at proofreading, errors may have occurred which can change the documentation meaning.          Final Clinical Impression(s) / ED Diagnoses Final diagnoses:  Right lower quadrant abdominal pain    Rx / DC Orders ED Discharge Orders     None         Fabienne Bruns 10/11/23 2205    Jacalyn Lefevre, MD 10/14/23 [phone]

## 2023-10-11 NOTE — ED Triage Notes (Signed)
 Pt came in via POV d/t RLQ abd pain  intermittently the last few months. Reports this AM with n/v & her abd has became more consistent. Does have Hx of a cyst on Rt ovary, feels the best when she hunches over. A/Ox4, rates her pain 9/10.

## 2023-10-12 MED ORDER — NAPROXEN 500 MG PO TABS: 500.0000 mg | ORAL_TABLET | Freq: Two times a day (BID) | ORAL | 0 refills | Status: AC

## 2023-10-29 ENCOUNTER — Emergency Department

## 2023-10-29 ENCOUNTER — Other Ambulatory Visit: Payer: Self-pay

## 2023-10-29 ENCOUNTER — Emergency Department
Admission: EM | Admit: 2023-10-29 | Discharge: 2023-10-29 | Disposition: A | Discharge status: Home / Self Care | Attending: Emergency Medicine | Admitting: Emergency Medicine

## 2023-10-29 DIAGNOSIS — R1031 Right lower quadrant pain: Secondary | ICD-10-CM | POA: Insufficient documentation

## 2023-10-29 DIAGNOSIS — R110 Nausea: Secondary | ICD-10-CM | POA: Insufficient documentation

## 2023-10-29 HISTORY — DX: Fibromyalgia: M79.7

## 2023-10-29 LAB — COMPREHENSIVE METABOLIC PANEL
ALT: 23 U/L (ref 0–44)
AST: 24 U/L (ref 15–41)
Albumin: 4.4 g/dL (ref 3.5–5.0)
Alkaline Phosphatase: 58 U/L (ref 38–126)
Anion gap: 8 (ref 5–15)
BUN: 12 mg/dL (ref 6–20)
CO2: 27 mmol/L (ref 22–32)
Calcium: 9.6 mg/dL (ref 8.9–10.3)
Chloride: 103 mmol/L (ref 98–111)
Creatinine, Ser: 0.71 mg/dL (ref 0.44–1.00)
GFR, Estimated: >60 mL/min (ref >60)
Glucose, Bld: 96 mg/dL (ref 70–99)
Potassium: 3.9 mmol/L (ref 3.5–5.1)
Sodium: 138 mmol/L (ref 135–145)
Total Bilirubin: 0.5 mg/dL (ref 0.0–1.2)
Total Protein: 7.4 g/dL (ref 6.5–8.1)

## 2023-10-29 LAB — CBC
HCT: 43.2 % (ref 36.0–46.0)
Hemoglobin: 14.7 g/dL (ref 12.0–15.0)
MCH: 30.7 pg (ref 26.0–34.0)
MCHC: 34 g/dL (ref 30.0–36.0)
MCV: 90.2 fL (ref 80.0–100.0)
Platelets: 248 10*3/uL (ref 150–400)
RBC: 4.79 MIL/uL (ref 3.87–5.11)
RDW: 11.8 % (ref 11.5–15.5)
WBC: 9.3 10*3/uL (ref 4.0–10.5)
nRBC: 0 % (ref 0.0–0.2)

## 2023-10-29 LAB — URINALYSIS, ROUTINE W REFLEX MICROSCOPIC
Bilirubin Urine: NEGATIVE
Glucose, UA: NEGATIVE mg/dL
Hgb urine dipstick: NEGATIVE
Ketones, ur: NEGATIVE mg/dL
Nitrite: NEGATIVE
Protein, ur: NEGATIVE mg/dL
Specific Gravity, Urine: 1.012 (ref 1.005–1.030)
pH: 5 (ref 5.0–8.0)

## 2023-10-29 LAB — POC URINE PREG, ED: Preg Test, Ur: NEGATIVE

## 2023-10-29 LAB — LIPASE, BLOOD: Lipase: 34 U/L (ref 11–51)

## 2023-10-29 MED ORDER — ONDANSETRON HCL 4 MG/2ML IJ SOLN
4.0000 mg | Freq: Once | INTRAMUSCULAR | Status: AC
  Administered 2023-10-29: 4 mg via INTRAVENOUS
  Filled 2023-10-29: qty 2

## 2023-10-29 MED ORDER — IOHEXOL 300 MG/ML  SOLN
100.0000 mL | Freq: Once | INTRAMUSCULAR | Status: AC | PRN
  Administered 2023-10-29: 100 mL via INTRAVENOUS

## 2023-10-29 MED ORDER — MORPHINE SULFATE (PF) 4 MG/ML IV SOLN
4.0000 mg | Freq: Once | INTRAVENOUS | Status: AC
  Administered 2023-10-29: 4 mg via INTRAVENOUS
  Filled 2023-10-29: qty 1

## 2023-10-29 MED ORDER — HALOPERIDOL LACTATE 5 MG/ML IJ SOLN
5.0000 mg | Freq: Once | INTRAMUSCULAR | Status: AC
  Administered 2023-10-29: 5 mg via INTRAVENOUS
  Filled 2023-10-29: qty 1

## 2023-10-29 MED ORDER — SODIUM CHLORIDE 0.9 % IV BOLUS
1000.0000 mL | Freq: Once | INTRAVENOUS | Status: AC
  Administered 2023-10-29: 1000 mL via INTRAVENOUS

## 2023-10-29 MED ORDER — DICYCLOMINE HCL 10 MG PO CAPS: 10.0000 mg | ORAL_CAPSULE | Freq: Three times a day (TID) | ORAL | 0 refills | Status: DC | PRN

## 2023-10-29 NOTE — ED Triage Notes (Signed)
 Pt was treated for appendicitis with antibiotics and has been taking them since Wednesday, pt was told to come be seen if pain returned and pt c/o worsened RLQ pain and N/V/D. Pt is AOX4, NAD noted.

## 2023-10-29 NOTE — ED Notes (Signed)
 Rt lower pelvic pain severe upon arrival to ED per pt is resolved @ this time post admin haldol 5mg  IVP per order

## 2023-10-29 NOTE — ED Provider Notes (Signed)
 [facility] Provider Note    Event Date/Time   First MD Initiated Contact with Patient 10/29/23 1805     (approximate)   History   Abdominal Pain   HPI  Katelyn Vazquez is a 28 y.o. female who presents to the emerged [place] department today because of concerns for continued right lower quadrant abdominal pain.  Patient states that the pain started a few weeks ago.  Was seen at an outside emergency department where she had CT and pelvic ultrasound performed.  Was diagnosed with mesenteric adenitis at that time.  However the pain has continued.  Saw her primary care doctor 2 days ago who prescribed Flagyl and Cipro.  She has been taking his antibiotics.  Today had severe pain.  This has been accompanied by some nausea.  She denies any fevers although has felt clammy.     Physical Exam   Triage Vital Signs: ED Triage Vitals  Encounter Vitals Group     BP 10/29/23 1616 125/88     Systolic BP Percentile --      Diastolic BP Percentile --      Pulse Rate 10/29/23 1616 84     Resp 10/29/23 1616 17     Temp 10/29/23 1616 98.4 F (36.9 C)     Temp Source 10/29/23 1616 Oral     SpO2 10/29/23 1616 96 %     Weight --      Height --      Head Circumference --      Peak Flow --      Pain Score 10/29/23 1617 7     Pain Loc --      Pain Education --      Exclude from Growth Chart --     Most recent vital signs: Vitals:   10/29/23 1616 10/29/23 1807  BP: 125/88 (!) 128/91  Pulse: 84 80  Resp: 17 18  Temp: 98.4 F (36.9 C)   SpO2: 96% 100%   General: Awake, alert, oriented. CV:  Good peripheral perfusion. Regular rate and rhythm. Resp:  Normal effort. Lungs clear. Abd:  No distention. Tender to palpation in the right lower quadrant. Positive Rovsing sign    ED Results / Procedures / Treatments   Labs (all labs ordered are listed, but only abnormal results are displayed) Labs Reviewed  URINALYSIS, ROUTINE W REFLEX MICROSCOPIC - Abnormal; Notable for  the following components:      Result Value   Color, Urine YELLOW (*)    APPearance CLOUDY (*)    Leukocytes,Ua LARGE (*)    Bacteria, UA RARE (*)    All other components within normal limits  URINE CULTURE  LIPASE, BLOOD  COMPREHENSIVE METABOLIC PANEL  CBC  POC URINE PREG, ED     EKG  None   RADIOLOGY I independently interpreted and visualized the CT abd/pel. My interpretation: No appendicitis Radiology interpretation:  IMPRESSION:  1. No acute abnormality in the abdomen/pelvis. Normal appendix.  2. The previous prominent ileocolic nodes have diminished in size.  3. Corpus luteal cyst in the right ovary is physiologic, however may  represent cause for pain.   US pelvis IMPRESSION:  1. Normal pelvic ultrasound.  2. Corpus luteal cyst on CT is not seen on this transabdominal exam      PROCEDURES:  Critical Care performed: No  MEDICATIONS ORDERED IN ED: Medications - No data to display   IMPRESSION / MDM / ASSESSMENT AND PLAN / ED COURSE  I  reviewed the triage vital signs and the nursing notes.                              Differential diagnosis includes, but is not limited to, appendicitis, mesenteric adenitis, UTI, ovarian pathology  Patient's presentation is most consistent with acute presentation with potential threat to life or bodily function.   Patient presented to the emergency department today because of concerns for continued right lower quadrant pain and concern for possible appendicitis.  On exam patient is tender in the right lower quadrant.  Blood work without any leukocytosis.  Will check CT.  CT scan did not show any signs of appendicitis.  Did raise concern for possible ovarian cyst.  Did obtain an ultrasound of the pelvis which not show any concerning abnormalities.  Patient did feel better after IV Haldol.  At this time somewhat unclear etiology of the patient's symptoms however given reassuring blood work, imaging and clinical improvement I  think it is reasonable for patient be discharged home.  Of note patient's urine did have some findings concerning for infection although she had multiple squamous cells.  Will send for culture.  Patient has already been on Cipro so I do have low concern for UTI.      FINAL CLINICAL IMPRESSION(S) / ED DIAGNOSES   Final diagnoses:  Right lower quadrant abdominal pain      Note:  This document was prepared using Dragon voice recognition software and may include unintentional dictation errors.    Phineas Semen, MD 10/29/23 2204

## 2023-10-31 ENCOUNTER — Telehealth: Payer: Self-pay | Admitting: Emergency Medicine

## 2023-10-31 LAB — URINE CULTURE: Culture: >=100000 — AB

## 2023-10-31 MED ORDER — DICYCLOMINE HCL 10 MG PO CAPS: 10.0000 mg | ORAL_CAPSULE | Freq: Three times a day (TID) | ORAL | 0 refills | Status: DC | PRN

## 2023-10-31 NOTE — Telephone Encounter (Signed)
 Patient called to advise that the pharmacy did not receive her prescription.  I will resend it.

## 2023-11-02 ENCOUNTER — Encounter: Payer: Self-pay | Admitting: Oncology

## 2023-11-02 ENCOUNTER — Ambulatory Visit: Payer: Self-pay | Admitting: Oncology

## 2023-11-02 ENCOUNTER — Other Ambulatory Visit: Payer: Self-pay

## 2023-11-02 VITALS — BP 125/82 | HR 88 | Temp 98.5°F | Ht 67.0 in | Wt 183.0 lb

## 2023-11-02 DIAGNOSIS — R112 Nausea with vomiting, unspecified: Secondary | ICD-10-CM

## 2023-11-02 DIAGNOSIS — R1031 Right lower quadrant pain: Secondary | ICD-10-CM

## 2023-11-02 NOTE — Progress Notes (Signed)
 [facility]  [address] [place], [place] [address] Phone: [phone] Fax: [phone]   Office Visit Note  Patient Name: Katelyn Vazquez  Date of IRJJO:[id number]  Med Rec number [id number]  Date of Service: 11/02/2023  Zithromax [azithromycin]  Chief Complaint  Patient presents with   RLQ pain    She has been seen twice in the ER within the past month. Pain is still persisting. She is scheduled to F/U with her PCP tomorrow but she was vomiting on her way to work this morning. She was prescribed dicyclomine by the ER but has not taken it yet. She also has Zofran but has not taken it this morning.   HPI Patient is a 28 y.o. who presents today for right lower abdominal pain x 1 to 2 months.  Reports she is still concerned that she may have appendicitis although imaging has not confirmed.  She had a CT scan and ultrasound on 10/29/2023 which did not reveal any abnormal findings except for luteal cyst to right ovary.  Lab work and UA were reassuring.  She has history of PCOS, fibromyalgia, POTS and anxiety.  Does report when she gets anxious she often times will get nauseated and throw up.  Reports this morning she did not take her Zofran because she felt fine and then as she was walking into her office, she had projectile vomiting along with right lower quadrant pain.  She was brought over by ER on security.    She was started on Flagyl and Cipro last week by PCP for possible mesenteric adenitis.  She stopped the antibiotics on Friday and restarted them yesterday.  Appetite is low although she is trying to eat a little something with her medications.  Denies any constipation or diarrhea.  Reports she has had ovarian cyst her whole life and typically they come and go and last about a week.  This pain feels different.  Reports her grandfather had similar symptoms and was diagnosed with appendicitis approximately 3 months later with subsequent removal of his appendix.  She was given  Zofran and Bentyl on Friday and has been taking the Zofran fairly regularly.  She has not tried the Bentyl yet.  She has been taking Tylenol which helps some.  Denies any fevers.  Current Medication:  Outpatient Encounter Medications as of 11/02/2023  Medication Sig Note   Bacillus Coagulans-Inulin (PROBIOTIC-PREBIOTIC PO) Take 1 each by mouth daily. Ollie Pre/Probiotic gummy    dexmethylphenidate (FOCALIN XR) 10 MG 24 hr capsule Take 10 mg by mouth daily.    dicyclomine (BENTYL) 10 MG capsule Take 1 capsule (10 mg total) by mouth 3 (three) times daily as needed (abdominal pain).    Levonorgestrel 19.5 MG IUD by Intrauterine route.    methocarbamol (ROBAXIN) 500 MG tablet Take 500 mg by mouth every 8 (eight) hours as needed for muscle spasms.    Multiple Vitamin (MULTIVITAMIN WITH MINERALS) TABS tablet Take 1 tablet by mouth daily.    ondansetron (ZOFRAN) 4 MG tablet Take 4 mg by mouth daily as needed for nausea or vomiting.    OVER THE COUNTER MEDICATION Take 4 capsules by mouth at bedtime. Myo Inisitol    SUMAtriptan (IMITREX) 50 MG tablet Take 50 mg by mouth See admin instructions. Take 1 tablet (50mg ) as needed for migraine. May repeat dose once if headache persists or recurs. D 10/11/2023: Unknown last dose.   vortioxetine HBr (TRINTELLIX) 20 MG TABS tablet Take 20 mg by mouth at  bedtime.    [DISCONTINUED] dicyclomine (BENTYL) 10 MG capsule Take 1 capsule (10 mg total) by mouth 3 (three) times daily as needed (abdominal pain, cramping).    [DISCONTINUED] Multiple Vitamins-Minerals (MULTIVITAMIN GUMMIES WOMENS) CHEW Chew 2 each by mouth daily. Ollie Women's Gummy Multivitamin    [DISCONTINUED] ondansetron (ZOFRAN-ODT) disintegrating tablet 4 mg     No facility-administered encounter medications on file as of 11/02/2023.      Medical History: Past Medical History:  Diagnosis Date   Abdominal pain 05/04/2017   Ehlers-Danlos syndrome    Fibromyalgia    Migraine headache    1-2x/month    POTS (postural orthostatic tachycardia syndrome)    Wears contact lenses      Vital Signs: BP 125/82   Pulse 88   Temp 98.5 F (36.9 C)   Ht 5\' 7"  (1.702 m)   Wt 183 lb (83 kg)   LMP 10/15/2023 (Approximate)   SpO2 99%   BMI 28.66 kg/m   ROS: As per HPI.  All other pertinent ROS negative.     Review of Systems  Constitutional:  Positive for appetite change. Negative for chills, diaphoresis, fatigue and fever.  Gastrointestinal:  Positive for abdominal pain, nausea and vomiting. Negative for constipation and diarrhea.  Genitourinary:  Positive for pelvic pain.    Physical Exam Constitutional:      Appearance: Normal appearance.  Abdominal:     General: Bowel sounds are normal.     Palpations: Abdomen is soft.     Tenderness: There is abdominal tenderness in the right lower quadrant. There is no right CVA tenderness or left CVA tenderness.  Neurological:     Mental Status: She is alert.     No results found for this or any previous visit (from the past 24 hours).  Assessment/Plan: 1. Right lower quadrant abdominal pain (Primary) Unclear etiology. She is followed closely by her PCP Dr. Hyacinth Meeker.  She sees him tomorrow. Currently on Cipro and Flagyl.  Discussed trying Bentyl 10 mg when she gets home to see if that helps with her pain. May need referral to gastroenterology if symptoms persist.   2. Nausea and vomiting, unspecified vomiting type Multifactorial and likely related to anxiety about her health and work along with possible GI/GYN origin. She was given 8 mg Zofran ODT while in clinic as she left hers at home. Recommend she continue taking as needed for nausea and vomiting. We discussed making sure she takes Zofran and/or eat something prior to antibiotics as they will also make her nauseated.  Disposition- F/u as scheduled with PCP tomorrow and with [place] as needed.  General Counseling: Josephene verbalizes understanding of the findings of todays visit and agrees  with plan of treatment. I have discussed any further diagnostic evaluation that may be needed or ordered today. We also reviewed her medications today. she has been encouraged to call the office with any questions or concerns that should arise related to todays visit.   No orders of the defined types were placed in this encounter.   No orders of the defined types were placed in this encounter.   I spent 25 minutes dedicated to the care of this patient (face-to-face and non-face-to-face) on the date of the encounter to include what is described in the assessment and plan.   Durenda Hurt, NP 11/02/2023 9:45 AM

## 2023-12-23 ENCOUNTER — Other Ambulatory Visit: Payer: Self-pay

## 2023-12-23 ENCOUNTER — Encounter
Admission: RE | Admit: 2023-12-23 | Discharge: 2023-12-23 | Disposition: A | Discharge status: Home / Self Care | Source: Ambulatory Visit | Attending: Obstetrics and Gynecology | Admitting: Obstetrics and Gynecology

## 2023-12-23 VITALS — Ht 67.0 in | Wt 180.0 lb

## 2023-12-23 DIAGNOSIS — Z01812 Encounter for preprocedural laboratory examination: Secondary | ICD-10-CM

## 2023-12-23 DIAGNOSIS — G90A Postural orthostatic tachycardia syndrome (POTS): Secondary | ICD-10-CM

## 2023-12-23 HISTORY — DX: Supraventricular tachycardia, unspecified: I47.10

## 2023-12-23 HISTORY — DX: Palpitations: R00.2
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
## 2023-12-23 NOTE — Patient Instructions (Addendum)
 Your procedure is scheduled on: Thursday, May 22 Report to the Registration Desk on the 1st floor of the [facility] To find out your arrival time, please call [phone] between 1PM - 3PM on: Wednesday, May 21 If your arrival time is 6:00 am, do not arrive before that time as the [facility] entrance doors do not open until 6:00 am.  REMEMBER: Instructions that are not followed completely may result in serious medical risk, up to and including death; or upon the discretion of your surgeon and anesthesiologist your surgery may need to be rescheduled.  Do not eat food after midnight the night before surgery.  No gum chewing or hard candies.  You may however, drink CLEAR liquids up to 2 hours before you are scheduled to arrive for your surgery. Do not drink anything within 2 hours of your scheduled arrival time.  Clear liquids include: - water  - apple juice without pulp - gatorade (not RED colors) - black coffee or tea (Do NOT add milk or creamers to the coffee or tea) Do NOT drink anything that is not on this list.  One week prior to surgery: starting May 15 Stop Anti-inflammatories (NSAIDS) such as Advil, Aleve , Ibuprofen, Motrin, Naproxen , Naprosyn  and Aspirin based products such as Excedrin, Goody's Powder, BC Powder. Stop ANY OVER THE COUNTER supplements until after surgery. Stop multiple vitamins, probiotic.  You may however, continue to take Tylenol  if needed for pain up until the day of surgery.  Continue taking all of your other prescription medications up until the day of surgery.  ON THE DAY OF SURGERY ONLY TAKE THESE MEDICATIONS WITH SIPS OF WATER:   Buspirone (Buspar)  No Alcohol for 24 hours before or after surgery.  No Smoking including e-cigarettes for 24 hours before surgery.  No chewable tobacco products for at least 6 hours before surgery.  No nicotine patches on the day of surgery.  Do not use any "recreational" drugs for at least a week (preferably 2  weeks) before your surgery.  Please be advised that the combination of cocaine and anesthesia may have negative outcomes, up to and including death. If you test positive for cocaine, your surgery will be cancelled.  On the morning of surgery brush your teeth with toothpaste and water, you may rinse your mouth with mouthwash if you wish. Do not swallow any toothpaste or mouthwash.  Use CHG Soap as directed on instruction sheet.  Do not wear jewelry, make-up, hairpins, clips or nail polish.  For welded (permanent) jewelry: bracelets, anklets, waist bands, etc.  Please have this removed prior to surgery.  If it is not removed, there is a chance that hospital personnel will need to cut it off on the day of surgery.  Do not wear lotions, powders, or perfumes.   Do not shave body hair from the neck down 48 hours before surgery.  Contact lenses, hearing aids and dentures may not be worn into surgery.  Do not bring valuables to the hospital. [facility] is not responsible for any missing/lost belongings or valuables.   Notify your doctor if there is any change in your medical condition (cold, fever, infection).  Wear comfortable clothing (specific to your surgery type) to the hospital.  After surgery, you can help prevent lung complications by doing breathing exercises.  Take deep breaths and cough every 1-2 hours. Your doctor may order a device called an Incentive Spirometer to help you take deep breaths. When coughing or sneezing, hold a pillow firmly  against your incision with both hands. This is called "splinting." Doing this helps protect your incision. It also decreases belly discomfort.  If you are being discharged the day of surgery, you will not be allowed to drive home. You will need a responsible individual to drive you home and stay with you for 24 hours after surgery.   If you are taking public transportation, you will need to have a responsible individual with you.  Please  call the Pre-admissions Testing Dept. at [phone] if you have any questions about these instructions.  Surgery Visitation Policy:  Patients having surgery or a procedure may have two visitors.  Children under the age of 59 must have an adult with them who is not the patient.      Preparing for Surgery with CHLORHEXIDINE GLUCONATE (CHG) Soap  Chlorhexidine Gluconate (CHG) Soap  o An antiseptic cleaner that kills germs and bonds with the skin to continue killing germs even after washing  o Used for showering the night before surgery and morning of surgery  Before surgery, you can play an important role by reducing the number of germs on your skin.  CHG (Chlorhexidine gluconate) soap is an antiseptic cleanser which kills germs and bonds with the skin to continue killing germs even after washing.  Please do not use if you have an allergy to CHG or antibacterial soaps. If your skin becomes reddened/irritated stop using the CHG.  1. Shower the NIGHT BEFORE SURGERY and the MORNING OF SURGERY with CHG soap.  2. If you choose to wash your hair, wash your hair first as usual with your normal shampoo.  3. After shampooing, rinse your hair and body thoroughly to remove the shampoo.  4. Use CHG as you would any other liquid soap. You can apply CHG directly to the skin and wash gently with a scrungie or a clean washcloth.  5. Apply the CHG soap to your body only from the neck down. Do not use on open wounds or open sores. Avoid contact with your eyes, ears, mouth, and genitals (private parts). Wash face and genitals (private parts) with your normal soap.  6. Wash thoroughly, paying special attention to the area where your surgery will be performed.  7. Thoroughly rinse your body with warm water.  8. Do not shower/wash with your normal soap after using and rinsing off the CHG soap.  9. Pat yourself dry with a clean towel.  10. Wear clean pajamas to bed the night before  surgery.  12. Place clean sheets on your bed the night of your first shower and do not sleep with pets.  13. Shower again with the CHG soap on the day of surgery prior to arriving at the hospital.  14. Do not apply any deodorants/lotions/powders.  15. Please wear clean clothes to the hospital.

## 2023-12-24 ENCOUNTER — Encounter
Admission: RE | Admit: 2023-12-24 | Discharge: 2023-12-24 | Disposition: A | Discharge status: Home / Self Care | Source: Ambulatory Visit | Attending: Obstetrics and Gynecology | Admitting: Obstetrics and Gynecology

## 2023-12-24 DIAGNOSIS — R1031 Right lower quadrant pain: Secondary | ICD-10-CM | POA: Insufficient documentation

## 2023-12-24 DIAGNOSIS — Z01812 Encounter for preprocedural laboratory examination: Secondary | ICD-10-CM | POA: Diagnosis not present

## 2023-12-24 DIAGNOSIS — Z01818 Encounter for other preprocedural examination: Secondary | ICD-10-CM | POA: Diagnosis present

## 2023-12-24 DIAGNOSIS — G90A Postural orthostatic tachycardia syndrome (POTS): Secondary | ICD-10-CM | POA: Diagnosis not present

## 2023-12-24 LAB — CBC
HCT: 42.7 % (ref 36.0–46.0)
Hemoglobin: 14.7 g/dL (ref 12.0–15.0)
MCH: 30.1 pg (ref 26.0–34.0)
MCHC: 34.4 g/dL (ref 30.0–36.0)
MCV: 87.5 fL (ref 80.0–100.0)
Platelets: 255 10*3/uL (ref 150–400)
RBC: 4.88 MIL/uL (ref 3.87–5.11)
RDW: 11.9 % (ref 11.5–15.5)
WBC: 8.8 10*3/uL (ref 4.0–10.5)
nRBC: 0 % (ref 0.0–0.2)

## 2023-12-24 LAB — BASIC METABOLIC PANEL WITH GFR
Anion gap: 9 (ref 5–15)
BUN: 16 mg/dL (ref 6–20)
CO2: 26 mmol/L (ref 22–32)
Calcium: 9.5 mg/dL (ref 8.9–10.3)
Chloride: 103 mmol/L (ref 98–111)
Creatinine, Ser: 0.78 mg/dL (ref 0.44–1.00)
GFR, Estimated: >60 mL/min (ref >60)
Glucose, Bld: 83 mg/dL (ref 70–99)
Potassium: 3.8 mmol/L (ref 3.5–5.1)
Sodium: 138 mmol/L (ref 135–145)

## 2023-12-24 LAB — TYPE AND SCREEN
ABO/RH(D): O POS
Antibody Screen: NEGATIVE

## 2023-12-29 MED ORDER — POVIDONE-IODINE 10 % EX SWAB
2.0000 | Freq: Once | CUTANEOUS | Status: AC
  Administered 2023-12-30: 2 via TOPICAL

## 2023-12-29 MED ORDER — ORAL CARE MOUTH RINSE: 15.0000 mL | Freq: Once | OROMUCOSAL | Status: AC

## 2023-12-29 MED ORDER — LACTATED RINGERS IV SOLN: INTRAVENOUS | Status: DC

## 2023-12-29 MED ORDER — CHLORHEXIDINE GLUCONATE 0.12 % MT SOLN
15.0000 mL | Freq: Once | OROMUCOSAL | Status: AC
  Administered 2023-12-30: 15 mL via OROMUCOSAL

## 2023-12-29 NOTE — Anesthesia Preprocedure Evaluation (Addendum)
 Anesthesia Evaluation  Patient identified by MRN, date of birth, ID band Patient awake    Reviewed: Allergy & Precautions, H&P , NPO status , Patient's Chart, lab work & pertinent test results  Airway Mallampati: II  TM Distance: >3 FB Neck ROM: full    Dental no notable dental hx.    Pulmonary neg pulmonary ROS   Pulmonary exam normal        Cardiovascular + dysrhythmias Supra Ventricular Tachycardia  Rhythm:Regular Rate:Normal  POTS who presents with heart racing and dizziness.  SVT identified during previous heart monitor evaluation. Current symptoms include palpitations, potentially linked to SVT. Diltiazem was previously effective in symptom management. No recent SVT episodes confirmed   Neuro/Psych  PSYCHIATRIC DISORDERS  Depression    negative neurological ROS     GI/Hepatic negative GI ROS, Neg liver ROS,,,  Endo/Other  negative endocrine ROS    Renal/GU      Musculoskeletal  (+)  Fibromyalgia -  Abdominal   Peds  Hematology negative hematology ROS (+)   Anesthesia Other Findings Past Medical History: No date: Ehlers-Danlos syndrome No date: Fibromyalgia 06/2022: Left thyroid nodule No date: Migraine headache     Comment:  1-2x/month No date: Palpitations No date: POTS (postural orthostatic tachycardia syndrome) No date: SVT (supraventricular tachycardia) (HCC) No date: Wears contact lenses  Past Surgical History: 2015: MOUTH SURGERY     Comment:  gum exploration 2024: MOUTH SURGERY     Comment:  re-graft and bone debridement 07/01/2022: THYROIDECTOMY, PARTIAL; Left 02/28/2019: TONSILLECTOMY; Bilateral     Comment:  Procedure: TONSILLECTOMY;  Surgeon: Von Grumbling, MD;                Location: [facility];  Service: ENT;                Laterality: Bilateral;  BMI    Body Mass Index: 28.19 kg/m      Reproductive/Obstetrics negative OB ROS                              Anesthesia Physical Anesthesia Plan  ASA: 2  Anesthesia Plan: General ETT   Post-op Pain Management: Toradol  IV (intra-op)* and Ofirmev  IV (intra-op)*   Induction: Intravenous  PONV Risk Score and Plan: 2 and Ondansetron , Dexamethasone , Midazolam , Propofol  infusion and Droperidol  Airway Management Planned: Oral ETT  Additional Equipment:   Intra-op Plan:   Post-operative Plan: Extubation in OR  Informed Consent: I have reviewed the patients History and Physical, chart, labs and discussed the procedure including the risks, benefits and alternatives for the proposed anesthesia with the patient or authorized representative who has indicated his/her understanding and acceptance.     Dental Advisory Given  Plan Discussed with: CRNA and Surgeon  Anesthesia Plan Comments:         Anesthesia Quick Evaluation

## 2023-12-30 ENCOUNTER — Other Ambulatory Visit: Payer: Self-pay

## 2023-12-30 ENCOUNTER — Ambulatory Visit: Payer: Self-pay | Admitting: Urgent Care

## 2023-12-30 ENCOUNTER — Encounter
Admission: RE | Disposition: A | Discharge status: Home / Self Care | Payer: Self-pay | Source: Home / Self Care | Attending: Obstetrics and Gynecology

## 2023-12-30 ENCOUNTER — Encounter: Payer: Self-pay | Admitting: Obstetrics and Gynecology

## 2023-12-30 ENCOUNTER — Ambulatory Visit: Admitting: Anesthesiology

## 2023-12-30 ENCOUNTER — Ambulatory Visit
Admission: RE | Admit: 2023-12-30 | Discharge: 2023-12-30 | Disposition: A | Discharge status: Home / Self Care | Attending: Obstetrics and Gynecology | Admitting: Obstetrics and Gynecology

## 2023-12-30 DIAGNOSIS — I472 Ventricular tachycardia, unspecified: Secondary | ICD-10-CM | POA: Diagnosis not present

## 2023-12-30 DIAGNOSIS — N803C2 Endometriosis of the left uterosacral ligament, unspecified depth: Secondary | ICD-10-CM | POA: Insufficient documentation

## 2023-12-30 DIAGNOSIS — F32A Depression, unspecified: Secondary | ICD-10-CM | POA: Insufficient documentation

## 2023-12-30 DIAGNOSIS — G90A Postural orthostatic tachycardia syndrome (POTS): Secondary | ICD-10-CM | POA: Diagnosis not present

## 2023-12-30 DIAGNOSIS — G8929 Other chronic pain: Secondary | ICD-10-CM | POA: Diagnosis not present

## 2023-12-30 DIAGNOSIS — R1031 Right lower quadrant pain: Secondary | ICD-10-CM

## 2023-12-30 DIAGNOSIS — Z01812 Encounter for preprocedural laboratory examination: Secondary | ICD-10-CM

## 2023-12-30 DIAGNOSIS — K660 Peritoneal adhesions (postprocedural) (postinfection): Secondary | ICD-10-CM | POA: Diagnosis not present

## 2023-12-30 DIAGNOSIS — Z30433 Encounter for removal and reinsertion of intrauterine contraceptive device: Secondary | ICD-10-CM | POA: Diagnosis not present

## 2023-12-30 DIAGNOSIS — M797 Fibromyalgia: Secondary | ICD-10-CM | POA: Insufficient documentation

## 2023-12-30 DIAGNOSIS — N80329 Endometriosis of the posterior cul-de-sac, unspecified depth: Secondary | ICD-10-CM | POA: Diagnosis not present

## 2023-12-30 DIAGNOSIS — Z79899 Other long term (current) drug therapy: Secondary | ICD-10-CM | POA: Insufficient documentation

## 2023-12-30 DIAGNOSIS — N809 Endometriosis, unspecified: Secondary | ICD-10-CM | POA: Diagnosis not present

## 2023-12-30 DIAGNOSIS — F419 Anxiety disorder, unspecified: Secondary | ICD-10-CM | POA: Diagnosis not present

## 2023-12-30 DIAGNOSIS — R102 Pelvic and perineal pain: Secondary | ICD-10-CM | POA: Diagnosis not present

## 2023-12-30 HISTORY — PX: INTRAUTERINE DEVICE (IUD) INSERTION: SHX5877

## 2023-12-30 HISTORY — PX: LAPAROSCOPY, DIAGNOSTIC, ROBOT-ASSISTED: SHX7606

## 2023-12-30 HISTORY — PX: IUD REMOVAL: SHX5392

## 2023-12-30 HISTORY — PX: XI ROBOTIC LAPAROSCOPIC ASSISTED APPENDECTOMY: SHX6877

## 2023-12-30 HISTORY — PX: ROBOTIC ASSISTED LAPAROSCOPIC LYSIS OF ADHESION: SHX6080

## 2023-12-30 LAB — ABO/RH: ABO/RH(D): O POS

## 2023-12-30 LAB — POCT PREGNANCY, URINE: Preg Test, Ur: NEGATIVE

## 2023-12-30 SURGERY — LAPAROSCOPY, DIAGNOSTIC, ROBOT-ASSISTED
Anesthesia: General | Site: Uterus

## 2023-12-30 MED ORDER — BUPIVACAINE HCL (PF) 0.5 % IJ SOLN
INTRAMUSCULAR | Status: DC | PRN
  Administered 2023-12-30: 10 mL

## 2023-12-30 MED ORDER — DEXAMETHASONE SODIUM PHOSPHATE 10 MG/ML IJ SOLN
INTRAMUSCULAR | Status: DC | PRN
  Administered 2023-12-30: 10 mg via INTRAVENOUS

## 2023-12-30 MED ORDER — PHENYLEPHRINE 80 MCG/ML (10ML) SYRINGE FOR IV PUSH (FOR BLOOD PRESSURE SUPPORT)
PREFILLED_SYRINGE | INTRAVENOUS | Status: DC | PRN
  Administered 2023-12-30 (×2): 80 ug via INTRAVENOUS

## 2023-12-30 MED ORDER — CELECOXIB 200 MG PO CAPS: 200.0000 mg | ORAL_CAPSULE | Freq: Two times a day (BID) | ORAL | 0 refills | Status: AC

## 2023-12-30 MED ORDER — DROPERIDOL 2.5 MG/ML IJ SOLN: 0.6250 mg | Freq: Once | INTRAMUSCULAR | Status: DC | PRN

## 2023-12-30 MED ORDER — OXYCODONE HCL 5 MG PO TABS
ORAL_TABLET | ORAL | Status: AC
  Filled 2023-12-30: qty 1

## 2023-12-30 MED ORDER — DEXMEDETOMIDINE HCL IN NACL 80 MCG/20ML IV SOLN
INTRAVENOUS | Status: DC | PRN
  Administered 2023-12-30: 8 ug via INTRAVENOUS
  Administered 2023-12-30: 4 ug via INTRAVENOUS
  Administered 2023-12-30: 8 ug via INTRAVENOUS
  Administered 2023-12-30: 4 ug via INTRAVENOUS

## 2023-12-30 MED ORDER — SUGAMMADEX SODIUM 200 MG/2ML IV SOLN
INTRAVENOUS | Status: DC | PRN
Start: 2023-12-30 — End: 2023-12-30
  Administered 2023-12-30: 150 mg via INTRAVENOUS

## 2023-12-30 MED ORDER — OXYCODONE HCL 5 MG/5ML PO SOLN: 5.0000 mg | Freq: Once | ORAL | Status: AC | PRN

## 2023-12-30 MED ORDER — KETOROLAC TROMETHAMINE 30 MG/ML IJ SOLN
INTRAMUSCULAR | Status: AC
  Filled 2023-12-30: qty 1

## 2023-12-30 MED ORDER — MIDAZOLAM HCL 2 MG/2ML IJ SOLN
INTRAMUSCULAR | Status: DC | PRN
  Administered 2023-12-30: 2 mg via INTRAVENOUS

## 2023-12-30 MED ORDER — PROPOFOL 1000 MG/100ML IV EMUL
INTRAVENOUS | Status: AC
  Filled 2023-12-30: qty 100

## 2023-12-30 MED ORDER — GABAPENTIN 600 MG PO TABS: 600.0000 mg | ORAL_TABLET | Freq: Every day | ORAL | 0 refills | Status: AC

## 2023-12-30 MED ORDER — ACETAMINOPHEN 10 MG/ML IV SOLN: 1000.0000 mg | Freq: Once | INTRAVENOUS | Status: DC | PRN

## 2023-12-30 MED ORDER — DEXAMETHASONE SODIUM PHOSPHATE 10 MG/ML IJ SOLN
INTRAMUSCULAR | Status: AC
  Filled 2023-12-30: qty 2

## 2023-12-30 MED ORDER — PROPOFOL 10 MG/ML IV BOLUS
INTRAVENOUS | Status: DC | PRN
  Administered 2023-12-30: 200 mg via INTRAVENOUS
  Administered 2023-12-30: 150 ug/kg/min via INTRAVENOUS

## 2023-12-30 MED ORDER — 0.9 % SODIUM CHLORIDE (POUR BTL) OPTIME
TOPICAL | Status: DC | PRN
  Administered 2023-12-30: 500 mL

## 2023-12-30 MED ORDER — CHLORHEXIDINE GLUCONATE 0.12 % MT SOLN
OROMUCOSAL | Status: AC
  Filled 2023-12-30: qty 15

## 2023-12-30 MED ORDER — HEMOSTATIC AGENTS (NO CHARGE) OPTIME
TOPICAL | Status: DC | PRN
  Administered 2023-12-30: 1

## 2023-12-30 MED ORDER — OXYCODONE HCL 5 MG PO TABS: 5.0000 mg | ORAL_TABLET | ORAL | 0 refills | Status: DC | PRN

## 2023-12-30 MED ORDER — FENTANYL CITRATE (PF) 100 MCG/2ML IJ SOLN
INTRAMUSCULAR | Status: DC | PRN
  Administered 2023-12-30: 50 ug via INTRAVENOUS
  Administered 2023-12-30 (×2): 25 ug via INTRAVENOUS
  Administered 2023-12-30 (×2): 50 ug via INTRAVENOUS

## 2023-12-30 MED ORDER — PROPOFOL 10 MG/ML IV BOLUS
INTRAVENOUS | Status: AC
  Filled 2023-12-30: qty 20

## 2023-12-30 MED ORDER — ACETAMINOPHEN 10 MG/ML IV SOLN
INTRAVENOUS | Status: DC | PRN
Start: 2023-12-30 — End: 2023-12-30
  Administered 2023-12-30: 1000 mg via INTRAVENOUS

## 2023-12-30 MED ORDER — LIDOCAINE HCL (PF) 2 % IJ SOLN
INTRAMUSCULAR | Status: AC
  Filled 2023-12-30: qty 5

## 2023-12-30 MED ORDER — ONDANSETRON HCL 4 MG/2ML IJ SOLN
INTRAMUSCULAR | Status: AC
  Filled 2023-12-30: qty 4

## 2023-12-30 MED ORDER — ROCURONIUM BROMIDE 10 MG/ML (PF) SYRINGE
PREFILLED_SYRINGE | INTRAVENOUS | Status: AC
  Filled 2023-12-30: qty 20

## 2023-12-30 MED ORDER — FENTANYL CITRATE (PF) 100 MCG/2ML IJ SOLN
INTRAMUSCULAR | Status: AC
  Filled 2023-12-30: qty 2

## 2023-12-30 MED ORDER — LIDOCAINE HCL (CARDIAC) PF 100 MG/5ML IV SOSY
PREFILLED_SYRINGE | INTRAVENOUS | Status: DC | PRN
  Administered 2023-12-30: 100 mg via INTRAVENOUS

## 2023-12-30 MED ORDER — ROCURONIUM BROMIDE 100 MG/10ML IV SOLN
INTRAVENOUS | Status: DC | PRN
  Administered 2023-12-30: 10 mg via INTRAVENOUS
  Administered 2023-12-30 (×2): 20 mg via INTRAVENOUS
  Administered 2023-12-30 (×2): 10 mg via INTRAVENOUS
  Administered 2023-12-30: 50 mg via INTRAVENOUS
  Administered 2023-12-30: 10 mg via INTRAVENOUS

## 2023-12-30 MED ORDER — LEVONORGESTREL 20 MCG/DAY IU IUD
INTRAUTERINE_SYSTEM | INTRAUTERINE | Status: AC
  Filled 2023-12-30: qty 1

## 2023-12-30 MED ORDER — ACETAMINOPHEN 10 MG/ML IV SOLN
INTRAVENOUS | Status: AC
  Filled 2023-12-30: qty 100

## 2023-12-30 MED ORDER — DOCUSATE SODIUM 100 MG PO CAPS: 100.0000 mg | ORAL_CAPSULE | Freq: Two times a day (BID) | ORAL | 0 refills | Status: DC

## 2023-12-30 MED ORDER — ONDANSETRON HCL 4 MG/2ML IJ SOLN
INTRAMUSCULAR | Status: DC | PRN
  Administered 2023-12-30: 4 mg via INTRAVENOUS

## 2023-12-30 MED ORDER — BUPIVACAINE HCL (PF) 0.5 % IJ SOLN
INTRAMUSCULAR | Status: AC
  Filled 2023-12-30: qty 30

## 2023-12-30 MED ORDER — OXYCODONE HCL 5 MG PO TABS
5.0000 mg | ORAL_TABLET | Freq: Once | ORAL | Status: AC | PRN
  Administered 2023-12-30: 5 mg via ORAL

## 2023-12-30 MED ORDER — FENTANYL CITRATE (PF) 100 MCG/2ML IJ SOLN
25.0000 ug | INTRAMUSCULAR | Status: DC | PRN
  Administered 2023-12-30 (×4): 25 ug via INTRAVENOUS
  Administered 2023-12-30: 50 ug via INTRAVENOUS

## 2023-12-30 MED ORDER — PHENYLEPHRINE 80 MCG/ML (10ML) SYRINGE FOR IV PUSH (FOR BLOOD PRESSURE SUPPORT)
PREFILLED_SYRINGE | INTRAVENOUS | Status: AC
  Filled 2023-12-30: qty 10

## 2023-12-30 MED ORDER — ACETAMINOPHEN EXTRA STRENGTH 500 MG PO TABS: 1000.0000 mg | ORAL_TABLET | Freq: Four times a day (QID) | ORAL | 0 refills | Status: AC

## 2023-12-30 MED ORDER — MIDAZOLAM HCL 2 MG/2ML IJ SOLN
INTRAMUSCULAR | Status: AC
  Filled 2023-12-30: qty 2

## 2023-12-30 MED ORDER — KETOROLAC TROMETHAMINE 30 MG/ML IJ SOLN
INTRAMUSCULAR | Status: DC | PRN
  Administered 2023-12-30: 30 mg via INTRAVENOUS

## 2023-12-30 MED ORDER — ONDANSETRON 4 MG PO TBDP: 4.0000 mg | ORAL_TABLET | Freq: Three times a day (TID) | ORAL | 0 refills | Status: AC | PRN

## 2023-12-30 MED ORDER — LEVONORGESTREL 20 MCG/DAY IU IUD
1.0000 | INTRAUTERINE_SYSTEM | INTRAUTERINE | Status: AC
  Administered 2023-12-30: 1 via INTRAUTERINE

## 2023-12-30 MED ORDER — SILVER NITRATE-POT NITRATE 75-25 % EX MISC
CUTANEOUS | Status: AC
Start: 2023-12-30 — End: ?
  Filled 2023-12-30: qty 10

## 2023-12-30 SURGICAL SUPPLY — 56 items
APPLICATOR ARISTA FLEXITIP XL (MISCELLANEOUS) IMPLANT
BAG URINE DRAIN 2000ML AR STRL (UROLOGICAL SUPPLIES) ×3 IMPLANT
BARRIER ADHS 3X4 INTERCEED (GAUZE/BANDAGES/DRESSINGS) IMPLANT
BLADE SURG SZ11 CARB STEEL (BLADE) ×3 IMPLANT
CATH URTH 16FR FL 2W BLN LF (CATHETERS) ×3 IMPLANT
COVER TIP SHEARS 8 DVNC (MISCELLANEOUS) ×3 IMPLANT
COVER WAND RF STERILE (DRAPES) ×3 IMPLANT
DERMABOND ADVANCED .7 DNX12 (GAUZE/BANDAGES/DRESSINGS) ×3 IMPLANT
DRAPE ARM DVNC X/XI (DISPOSABLE) ×9 IMPLANT
DRAPE COLUMN DVNC XI (DISPOSABLE) ×3 IMPLANT
DRSG TEGADERM 2-3/8X2-3/4 SM (GAUZE/BANDAGES/DRESSINGS) IMPLANT
DRSG TELFA 3X8 NADH STRL (GAUZE/BANDAGES/DRESSINGS) IMPLANT
ELECTRODE REM PT RTRN 9FT ADLT (ELECTROSURGICAL) ×3 IMPLANT
FORCEPS BPLR FENES DVNC XI (FORCEP) ×3 IMPLANT
GAUZE SPONGE 2X2 STRL 8-PLY (GAUZE/BANDAGES/DRESSINGS) IMPLANT
GLOVE BIO SURGEON STRL SZ7 (GLOVE) ×12 IMPLANT
GLOVE INDICATOR 7.5 STRL GRN (GLOVE) ×12 IMPLANT
GOWN STRL REUS W/ TWL LRG LVL3 (GOWN DISPOSABLE) ×12 IMPLANT
GRASPER SUT TROCAR 14GX15 (MISCELLANEOUS) ×3 IMPLANT
HEMOSTAT ARISTA ABSORB 1G (HEMOSTASIS) IMPLANT
IRRIGATION STRYKERFLOW (MISCELLANEOUS) IMPLANT
IV NS 1000ML BAXH (IV SOLUTION) IMPLANT
KIT PINK PAD W/HEAD ARE REST (MISCELLANEOUS) ×3 IMPLANT
KIT PINK PAD W/HEAD ARM REST (MISCELLANEOUS) ×3 IMPLANT
KIT TURNOVER CYSTO (KITS) ×3 IMPLANT
LABEL OR SOLS (LABEL) ×3 IMPLANT
MANIFOLD NEPTUNE II (INSTRUMENTS) ×3 IMPLANT
MANIPULATOR UTERINE 4.5 ZUMI (MISCELLANEOUS) ×3 IMPLANT
Mirena levonorgestrel-releasing intrauterine syste IMPLANT
NDL DRIVE SUT CUT DVNC (INSTRUMENTS) ×3 IMPLANT
NEEDLE DRIVE SUT CUT DVNC (INSTRUMENTS) ×3 IMPLANT
NS IRRIG 1000ML POUR BTL (IV SOLUTION) ×3 IMPLANT
OBTURATOR OPTICALSTD 8 DVNC (TROCAR) ×3 IMPLANT
PACK DNC HYST (MISCELLANEOUS) ×3 IMPLANT
PACK GYN LAPAROSCOPIC (MISCELLANEOUS) ×3 IMPLANT
PAD PREP OB/GYN DISP 24X41 (PERSONAL CARE ITEMS) ×3 IMPLANT
RELOAD STAPLE 45 3.5 BLU DVNC (STAPLE) IMPLANT
RELOAD STAPLER 3.5X45 BLU DVNC (STAPLE) ×3 IMPLANT
SCISSORS MNPLR CVD DVNC XI (INSTRUMENTS) ×3 IMPLANT
SCRUB CHG 4% DYNA-HEX 4OZ (MISCELLANEOUS) ×3 IMPLANT
SEAL UNIV 5-12 XI (MISCELLANEOUS) ×9 IMPLANT
SEALER VESSEL EXT DVNC XI (MISCELLANEOUS) IMPLANT
SET CYSTO W/LG BORE CLAMP LF (SET/KITS/TRAYS/PACK) IMPLANT
SET TUBE SMOKE EVAC HIGH FLOW (TUBING) ×3 IMPLANT
SOLUTION ELECTROSURG ANTI STCK (MISCELLANEOUS) ×3 IMPLANT
SOLUTION PREP PVP 2OZ (MISCELLANEOUS) ×3 IMPLANT
STAPLER 45 SUREFORM DVNC (STAPLE) IMPLANT
SURGILUBE 2OZ TUBE FLIPTOP (MISCELLANEOUS) ×3 IMPLANT
SUT STRATA 2-0 30 CT-2 (SUTURE) IMPLANT
SUT VIC AB 0 CT2 27 (SUTURE) ×6 IMPLANT
SUTURE MNCRL 4-0 27XMF (SUTURE) ×3 IMPLANT
SYR 50ML LL SCALE MARK (SYRINGE) ×3 IMPLANT
SYSTEM RETRIEVL 5MM INZII UNIV (BASKET) IMPLANT
TOWEL OR 17X26 4PK STRL BLUE (TOWEL DISPOSABLE) ×3 IMPLANT
TRAP FLUID SMOKE EVACUATOR (MISCELLANEOUS) ×3 IMPLANT
WATER STERILE IRR 500ML POUR (IV SOLUTION) ×3 IMPLANT

## 2023-12-30 NOTE — Transfer of Care (Signed)
 Immediate Anesthesia Transfer of Care Note  Patient: Katelyn Vazquez  Procedure(s) Performed: LAPAROSCOPY, DIAGNOSTIC, ROBOT-ASSISTED (Pelvis) REMOVAL, INTRAUTERINE DEVICE (Uterus) INSERTION, INTRAUTERINE DEVICE (Uterus) EXCISION, ENDOMETRIOSIS, LAPAROSCOPIC (Pelvis) LYSIS, ADHESIONS, ROBOT-ASSISTED, LAPAROSCOPIC (Pelvis) APPENDECTOMY, ROBOT-ASSISTED, LAPAROSCOPIC (Abdomen)  Patient Location: PACU  Anesthesia Type:General  Level of Consciousness: drowsy and patient cooperative  Airway & Oxygen Therapy: Patient Spontanous Breathing and Patient connected to face mask oxygen  Post-op Assessment: Report given to RN, Post -op Vital signs reviewed and stable, and Patient moving all extremities X 4  Post vital signs: Reviewed and stable  Last Vitals:  Vitals Value Taken Time  BP 101/68 12/30/23 1115  Temp    Pulse 88 12/30/23 1120  Resp 18 12/30/23 1120  SpO2 100 % 12/30/23 1120  Vitals shown include unfiled device data.  Last Pain:  Vitals:   12/30/23 0620  TempSrc: Temporal  PainSc: 4          Complications: No notable events documented.

## 2023-12-30 NOTE — Anesthesia Postprocedure Evaluation (Signed)
 Anesthesia Post Note  Patient: Katelyn Vazquez  Procedure(s) Performed: LAPAROSCOPY, DIAGNOSTIC, ROBOT-ASSISTED (Pelvis) REMOVAL, INTRAUTERINE DEVICE (Uterus) INSERTION, INTRAUTERINE DEVICE (Uterus) EXCISION, ENDOMETRIOSIS, LAPAROSCOPIC (Pelvis) LYSIS, ADHESIONS, ROBOT-ASSISTED, LAPAROSCOPIC (Pelvis) APPENDECTOMY, ROBOT-ASSISTED, LAPAROSCOPIC (Abdomen)  Patient location during evaluation: PACU Anesthesia Type: General Level of consciousness: awake and alert, oriented and patient cooperative Pain management: pain level controlled Vital Signs Assessment: post-procedure vital signs reviewed and stable Respiratory status: spontaneous breathing, nonlabored ventilation and respiratory function stable Cardiovascular status: blood pressure returned to baseline and stable Postop Assessment: adequate PO intake Anesthetic complications: no   No notable events documented.   Last Vitals:  Vitals:   12/30/23 1145 12/30/23 1200  BP: 99/70 95/69  Pulse: 75 79  Resp: 20 14  Temp:    SpO2: 100% 100%    Last Pain:  Vitals:   12/30/23 1210  TempSrc:   PainSc: 5                  Dorothey Gate

## 2023-12-30 NOTE — Op Note (Signed)
 Procedure Date:  12/30/2023  Pre-operative Diagnosis:  Endometriosis  Post-operative Diagnosis: Endometriosis, with an area overlying the right lower quadrant near the appendix  Procedure:  Robotic assisted Appendectomy  Surgeon:  Marene Shape, MD  Anesthesia:  General endotracheal  Estimated Blood Loss:  2 ml  Specimens:  Appendix  Complications:  None  Description of Procedure: Dr. Alvia Awkward called me into the OR for intraoperative consultation for appendectomy.  The patient has history of endometriosis and intraoperatively, she was found to have an area in the right lower quadrant, adjacent to the appendix.  As a precaution, and to prevent further confounding variables, it was decided to perform an appendectomy.  The patient was already prepped, draped, and DaVinci platform was already docked.  Using [place]  bipolar forceps and cautery scissors, I dissected the appendix off adjacent adhesions.  The mesoappendix was taken down using both instruments without any bleeding issues.  The appendix was fully mobilized to its base.  The left lower quadrant 8 mm port was upsized to a 12 mm port, and a robotic 45 mm blue load stapler was inserted.  The appendix was then stapled at its base.  No bleeding was noted from the staple line.  The appendix was then placed in an EndoCatch bag.  At this point, I turned the case over back to Dr. Alvia Awkward.  Please see her operative note for further details.   Marene Shape, MD

## 2023-12-30 NOTE — Discharge Instructions (Signed)
 Laparoscopic Ovarian Surgery Discharge Instructions  For the next three days, take ibuprofen and acetaminophen  on a schedule, every 8 hours. You can take them together or you can intersperse them, and take one every four hours. I also gave you gabapentin for nighttime, to help you sleep and also to control pain. Take gabapentin medicines at night for at least the next 3 nights. You also have a narcotic, oxycodone , to take as needed if the above medicines don't help.  Postop constipation is a major cause of pain. Stay well hydrated, walk as you tolerate, and take over the counter senna as well as stool softeners if you need them.   RISKS AND COMPLICATIONS  Infection. Bleeding. Injury to surrounding organs. Anesthetic side effects.   PROCEDURE  You may be given a medicine to help you relax (sedative) before the procedure. You will be given a medicine to make you sleep (general anesthetic) during the procedure. A tube will be put down your throat to help your breath while under general anesthesia. Several small cuts (incisions) are made in the lower abdominal area and one incision is made near the belly button. Your abdominal area will be inflated with a safe gas (carbon dioxide). This helps give the surgeon room to operate, visualize, and helps the surgeon avoid other organs. A thin, lighted tube (laparoscope) with a camera attached is inserted into your abdomen through the incision near the belly button. Other small instruments may also be inserted through other abdominal incisions. The ovary is located and are removed. After the ovary is removed, the gas is released from the abdomen. The incisions will be closed with stitches (sutures), and Dermabond. A bandage may be placed over the incisions.  AFTER THE PROCEDURE  You will also have some mild abdominal discomfort for 3-7 days. You will be given pain medicine to ease any discomfort. As long as there are no problems, you may be allowed to  go home. Someone will need to drive you home and be with you for at least 24 hours once home. You may have some mild discomfort in the throat. This is from the tube placed in your throat while you were sleeping. You may experience discomfort in the shoulder area from some trapped air between the liver and diaphragm. This sensation is normal and will slowly go away on its own.  HOME CARE INSTRUCTIONS  Take all medicines as directed. Only take over-the-counter or prescription medicines for pain, discomfort, or fever as directed by your caregiver. Resume daily activities as directed. Showers are preferred over baths for 2 weeks. You may resume sexual activities in 1 week or as you feel you would like to. Do not drive while taking narcotics.  SEEK MEDICAL CARE IF: . There is increasing abdominal pain. You feel lightheaded or faint. You have the chills. You have an oral temperature above 102 F (38.9 C). There is pus-like (purulent) drainage from any of the wounds. You are unable to pass gas or have a bowel movement. You feel sick to your stomach (nauseous) or throw up (vomit) and can't control it with your medicines.  MAKE SURE YOU:  Understand these instructions. Will watch your condition. Will get help right away if you are not doing well or get worse.  ExitCare Patient Information 2013 [place], [place].    Laparoscopic Ovarian Surgery Discharge Instructions  For the next three days, take ibuprofen and acetaminophen  on a schedule, every 8 hours. You can take them together or you can intersperse them,  and take one every four hours. I also gave you gabapentin for nighttime, to help you sleep and also to control pain. Take gabapentin medicines at night for at least the next 3 nights. You also have a narcotic, oxycodone , to take as needed if the above medicines don't help.  Postop constipation is a major cause of pain. Stay well hydrated, walk as you tolerate, and take over the counter  senna as well as stool softeners if you need them.   RISKS AND COMPLICATIONS  Infection. Bleeding. Injury to surrounding organs. Anesthetic side effects.   PROCEDURE  You may be given a medicine to help you relax (sedative) before the procedure. You will be given a medicine to make you sleep (general anesthetic) during the procedure. A tube will be put down your throat to help your breath while under general anesthesia. Several small cuts (incisions) are made in the lower abdominal area and one incision is made near the belly button. Your abdominal area will be inflated with a safe gas (carbon dioxide). This helps give the surgeon room to operate, visualize, and helps the surgeon avoid other organs. A thin, lighted tube (laparoscope) with a camera attached is inserted into your abdomen through the incision near the belly button. Other small instruments may also be inserted through other abdominal incisions. The ovary is located and are removed. After the ovary is removed, the gas is released from the abdomen. The incisions will be closed with stitches (sutures), and Dermabond. A bandage may be placed over the incisions.  AFTER THE PROCEDURE  You will also have some mild abdominal discomfort for 3-7 days. You will be given pain medicine to ease any discomfort. As long as there are no problems, you may be allowed to go home. Someone will need to drive you home and be with you for at least 24 hours once home. You may have some mild discomfort in the throat. This is from the tube placed in your throat while you were sleeping. You may experience discomfort in the shoulder area from some trapped air between the liver and diaphragm. This sensation is normal and will slowly go away on its own.  HOME CARE INSTRUCTIONS  Take all medicines as directed. Only take over-the-counter or prescription medicines for pain, discomfort, or fever as directed by your caregiver. Resume daily activities as  directed. Showers are preferred over baths for 2 weeks. You may resume sexual activities in 1 week or as you feel you would like to. Do not drive while taking narcotics.  SEEK MEDICAL CARE IF: . There is increasing abdominal pain. You feel lightheaded or faint. You have the chills. You have an oral temperature above 102 F (38.9 C). There is pus-like (purulent) drainage from any of the wounds. You are unable to pass gas or have a bowel movement. You feel sick to your stomach (nauseous) or throw up (vomit) and can't control it with your medicines.  MAKE SURE YOU:  Understand these instructions. Will watch your condition. Will get help right away if you are not doing well or get worse.  ExitCare Patient Information 2013 [place], [place].

## 2023-12-30 NOTE — Anesthesia Procedure Notes (Signed)
 Procedure Name: Intubation Date/Time: 12/30/2023 7:44 AM  Performed by: Noelia Batman, CRNAPre-anesthesia Checklist: Patient identified, Emergency Drugs available, Suction available and Patient being monitored Patient Re-evaluated:Patient Re-evaluated prior to induction Oxygen Delivery Method: Circle System Utilized Preoxygenation: Pre-oxygenation with 100% oxygen Induction Type: IV induction Ventilation: Mask ventilation without difficulty Laryngoscope Size: McGrath and 4 Grade View: Grade I Tube type: Oral Tube size: 7.0 mm Number of attempts: 1 Airway Equipment and Method: Stylet and Oral airway Placement Confirmation: ETT inserted through vocal cords under direct vision, positive ETCO2 and breath sounds checked- equal and bilateral Secured at: 21 cm Tube secured with: Tape Dental Injury: Teeth and Oropharynx as per pre-operative assessment

## 2023-12-30 NOTE — Op Note (Signed)
 Katelyn Vazquez PROCEDURE DATE: 12/30/2023  PREOPERATIVE DIAGNOSIS: Chronic pelvic pain, worse in RLQ, other etiologies ruled out POSTOPERATIVE DIAGNOSIS: Stage 3 endometriosis  PROCEDURE:  Panel 1 LAPAROSCOPY, DIAGNOSTIC, ROBOT-ASSISTED: 49320 (CPT) REMOVAL, INTRAUTERINE DEVICE: 58301 (CPT) INSERTION, INTRAUTERINE DEVICE: 58300 (CPT) EXCISION, ENDOMETRIOSIS, LAPAROSCOPIC: 16109 (CPT) LYSIS, ADHESIONS, ROBOT-ASSISTED, LAPAROSCOPIC: 58660 (CPT) Panel 2 APPENDECTOMY, ROBOT-ASSISTED, LAPAROSCOPIC:   SURGEON:  Dr. Prescilla Brod  General surgery intra-op consult: Appendectomy by Dr. Mauri Sous  ASSISTANT: Everlena Hoard, MD  ANESTHESIOLOGIST: No responsible provider has been recorded for the case. Anesthesiologist: Baltazar Bonier, MD CRNA: Noelia Batman, CRNA; Curvin Downing, CRNA  INDICATIONS: 28 y.o. F with history of chronic pelvic pain desiring surgical evaluation.   Please see preoperative notes for further details.  Risks of surgery were discussed with the patient including but not limited to: bleeding which may require transfusion or reoperation; infection which may require antibiotics; injury to bowel, bladder, ureters or other surrounding organs; need for additional procedures including laparotomy; thromboembolic phenomenon, incisional problems and other postoperative/anesthesia complications. Written informed consent was obtained.    FINDINGS:  Small uterus, normal ovaries and fallopian tubes bilaterally.  Prominent ectropion in the cervix with notable friability.   Normal upper abdomen.  Normal diaphragmatic and peritoneal surfaces in the right and left upper quadrants.  Normal liver and gallbladder noted.   Her left lower quadrant was significant for adhesions of the bowel at the sidewall.  No endometriosis was noted at this location.  A deep infiltrating lesion was noted in the right lower quadrant anterior to the appendix, which was excised.  The  appendix appeared to be scarred with filmy adhesions along its entire length, and it did descend into the pelvis but was not adherent to the pelvic sidewall.  Deep infiltrating endometriosis was noted in the left and right ovarian fossae.  In both areas they were deep infiltrating lesions extrinsic to the ureter along the path of the ureters course.  In the right ovarian fossa was a large peritoneal window extended well into the retroperitoneal space, almost to the obturator area.  Deep infiltrating lesions were noted in this area as well, extrinsic to the ureter but along its course.  Posterior cul-de-sac also had infiltrating endometriosis along the superior edge just on the underside of the cervix.  The uterosacral on the left had superficial endometriosis along it but not on the right.  No dark or vesicular lesions were noted over the colon.  The bladder also appeared to be free of endometriosis.  Right ovary with some dark endometriosis powder burn lesions on the underside of it.  Neither ovary appeared to have endometriosis lesions nor did either ovary appear to have an endometrioma.  Peritoneum was excised in a long single unit in the bilateral fossae, and in the posterior cul-de-sac. The specimens were sent to pathology.   No other abdominal/pelvic abnormality.   Her IUD was removed and was replaced with a Mirena IUD.  Lot number [id number]  This case took multiple hours to ensure safety and removal of all abnormal tissue.  A modifier 22 was appropriate for the lysis of adhesions and excision of endometriosis that was undertaken, and included 100 minutes.  ANESTHESIA:    General INTRAVENOUS FLUIDS: 900 ml ESTIMATED BLOOD LOSS: 10 ml URINE OUTPUT: 300 ml SPECIMENS: Peritoneal biopsies and excision of endometriosis throughout the pelvis and in the right lower quadrant. COMPLICATIONS: None immediate  PROCEDURE IN DETAIL:  The patient had sequential compression devices applied to her lower  extremities while in the preoperative area.  She was then taken to the operating room where general anesthesia was administered and was found to be adequate.  She was placed in the dorsal lithotomy position, and was prepped and draped in a sterile manner.  A Foley catheter was inserted into her bladder and attached to constant drainage and a uterine manipulator was then advanced into the uterus.  After an adequate timeout was performed, attention was turned to the abdomen where an umbilical incision was made with the scalpel.  The 8-mm robotic trocar and sleeve were then advanced without difficulty with the laparoscope under direct visualization into the abdomen.  The abdomen was then insufflated with carbon dioxide gas and adequate pneumoperitoneum was obtained.   A detailed survey of the patient's pelvis and abdomen revealed the findings as mentioned above.  2 more robotic ports were placed laterally under direct visualization.  A full skin of the upper abdomen and lower abdomen noted the findings above.  The adhesions in the left and right lower quadrants were removed, and the appendix evaluated.  The decision was made to call and general surgery for appendectomy at this time, and the endometriosis in the right lower quadrant was completely excised.  Please see Dr. Carmen Chol note for the appendectomy.  At the conclusion of the appendectomy, the specimen was removed in a bag through the 12 mm port, which had been placed in the left lower quadrant for this reason.  Ureterolysis Attention was then turned to the pelvis, where a close scan of the peritoneal surfaces revealed the findings above.  Starting on the left, peritoneal excision of all the areas of endometriosis was undertaken.  Bilateral ureterolysis was required to completely excise the endometriosis on the left, and to excise the peritoneal defect and deep infiltrating endometriosis in the right.  Great care was taken to avoid blood loss and  damage to the ureter, and the entire thing was peeled into the medial leaflet of the broad ligament.  The left uterosacral was also unroofed and the endometriosis removed from this area.  In the posterior cul-de-sac, care was taken to excise the endometriosis completely, and to avoid the bowel.  The operative site was surveyed, and it was found to be hemostatic.  No intraoperative injury to surrounding organs was noted.  Arista was placed over the pelvic areas, and Interceed was placed over the entire defect on both sides to prevent retroperitoneal scarring of the bilateral ovaries.  Pictures were taken of the quadrants and pelvis. The abdomen was desufflated and all instruments were then removed from the patient's abdomen. The uterine manipulator was removed without complications.  All incisions were closed with 4-0 Monocryl and Dermabond.   The patient tolerated the procedures well.  All instruments, needles, and sponge counts were correct x 2. The patient was taken to the recovery room in stable condition.

## 2023-12-30 NOTE — Interval H&P Note (Signed)
 History and Physical Interval Note:  12/30/2023 7:24 AM  Mayo Speck  has presented today for surgery, with the diagnosis of right lower quadrant pain.  The various methods of treatment have been discussed with the patient and family. After consideration of risks, benefits and other options for treatment, the patient has consented to  Procedure(s) with comments: LAPAROSCOPY, DIAGNOSTIC, ROBOT-ASSISTED (N/A) REMOVAL, INTRAUTERINE DEVICE (N/A) INSERTION, INTRAUTERINE DEVICE (N/A) - MIRENA EXCISION, ENDOMETRIOSIS, LAPAROSCOPIC (N/A) LYSIS, ADHESIONS, ROBOT-ASSISTED, LAPAROSCOPIC (N/A) as a surgical intervention.  The patient's history has been reviewed, patient examined, no change in status, stable for surgery.  I have reviewed the patient's chart and labs.  Questions were answered to the patient's satisfaction.     Prescilla Brod

## 2023-12-31 ENCOUNTER — Encounter: Payer: Self-pay | Admitting: Obstetrics and Gynecology

## 2023-12-31 ENCOUNTER — Other Ambulatory Visit: Payer: Self-pay | Admitting: Obstetrics and Gynecology

## 2023-12-31 LAB — SURGICAL PATHOLOGY

## 2023-12-31 NOTE — Progress Notes (Signed)
 Refill on oxycodone , sent from office

## 2024-01-12 ENCOUNTER — Ambulatory Visit (INDEPENDENT_AMBULATORY_CARE_PROVIDER_SITE_OTHER): Admitting: Physician Assistant

## 2024-01-12 ENCOUNTER — Encounter: Payer: Self-pay | Admitting: Physician Assistant

## 2024-01-12 VITALS — BP 113/80 | HR 85 | Temp 98.9°F | Ht 67.0 in | Wt 182.0 lb

## 2024-01-12 DIAGNOSIS — Z09 Encounter for follow-up examination after completed treatment for conditions other than malignant neoplasm: Secondary | ICD-10-CM

## 2024-01-12 NOTE — Patient Instructions (Signed)

## 2024-01-12 NOTE — Progress Notes (Signed)
 [facility] POST-OP OFFICE VISIT  01/12/2024  HPI: Katelyn Vazquez is a 28 y.o. female 13 days s/p robotic assisted laparoscopic appendectomy secondary to endometriosis overlaying appendix with Dr Marletta Simmering   She has done well She has had soreness mainly at the left lateral port site however, this appears to be the extraction site No fever, chills, emesis She was reporting nausea pre-operatively but notes this seems to have resolved Incisions are otherwise well healed Ambulating well No other complaints   Vital signs: BP 113/80   Pulse 85   Temp 98.9 F (37.2 C) (Oral)   Ht 5\' 7"  (1.702 m)   Wt 182 lb (82.6 kg)   SpO2 99%   BMI 28.51 kg/m    Physical Exam: Constitutional: Well appearing female, NAD Abdomen: Soft, non-tender, non-distended, no rebound/guarding Skin: Laparoscopic incisions are healing well, no erythema or drainage. Of note, she did report gauze being over her left lateral port site afterwards which her mother removed on POD2 and there is residual gauze present in dermabond. This did not appear ready for removal today.   Assessment/Plan: This is a 28 y.o. female 13 days s/p robotic assisted laparoscopic appendectomy secondary to endometriosis overlaying appendix with Dr Marletta Simmering    - I do believe her residual soreness is secondary to her incisions and the closure of this. No evidence of complication, abscess, hematoma  - Pain control prn; OTC modalities should be sufficient   - Reviewed wound care recommendation  - Reviewed lifting restrictions from general surgery perspective; 4 weeks total  - Reviewed surgical pathology; Benign Appendix, areas of endometriosis  - She can follow up on as needed basis; She understands to call with questions/concerns  -- [name], PA-C [facility] 01/12/2024, 3:31 PM M-F: 7am - 4pm

## 2024-02-01 ENCOUNTER — Encounter: Payer: Self-pay | Admitting: Obstetrics and Gynecology

## 2024-05-11 ENCOUNTER — Other Ambulatory Visit (HOSPITAL_COMMUNITY): Payer: Self-pay

## 2024-05-11 ENCOUNTER — Other Ambulatory Visit: Payer: Self-pay

## 2024-05-12 ENCOUNTER — Other Ambulatory Visit: Payer: Self-pay

## 2024-05-12 MED ORDER — TRINTELLIX 20 MG PO TABS
20.0000 mg | ORAL_TABLET | Freq: Every day | ORAL | 2 refills | Status: DC
  Filled 2024-05-12 (×3): qty 30, 30d supply, fill #0
  Filled 2024-06-05 – 2024-06-14 (×2): qty 30, 30d supply, fill #1
  Filled 2024-07-10: qty 30, 30d supply, fill #2

## 2024-05-15 ENCOUNTER — Other Ambulatory Visit (HOSPITAL_COMMUNITY): Payer: Self-pay

## 2024-05-15 ENCOUNTER — Other Ambulatory Visit: Payer: Self-pay

## 2024-06-14 ENCOUNTER — Other Ambulatory Visit: Payer: Self-pay

## 2024-06-15 ENCOUNTER — Other Ambulatory Visit: Payer: Self-pay

## 2024-07-13 ENCOUNTER — Other Ambulatory Visit: Payer: Self-pay

## 2024-07-13 MED ORDER — METHYLPHENIDATE HCL ER (LA) 30 MG PO CP24: 30.0000 mg | ORAL_CAPSULE | Freq: Every day | ORAL | 0 refills | Status: AC

## 2024-07-20 ENCOUNTER — Other Ambulatory Visit: Payer: Self-pay

## 2024-07-20 MED ORDER — METHYLPHENIDATE HCL 5 MG PO TABS
5.0000 mg | ORAL_TABLET | Freq: Every day | ORAL | 0 refills | Status: AC
  Filled 2024-07-20: qty 30, 30d supply, fill #0

## 2024-07-21 ENCOUNTER — Other Ambulatory Visit: Payer: Self-pay

## 2024-07-21 MED ORDER — METHYLPHENIDATE HCL ER (LA) 30 MG PO CP24
30.0000 mg | ORAL_CAPSULE | Freq: Every day | ORAL | 0 refills | Status: AC
  Filled 2024-07-21: qty 30, 30d supply, fill #0

## 2024-08-22 ENCOUNTER — Other Ambulatory Visit: Payer: Self-pay

## 2024-08-22 MED ORDER — METHYLPHENIDATE HCL ER (LA) 30 MG PO CP24
30.0000 mg | ORAL_CAPSULE | Freq: Every day | ORAL | 0 refills | Status: AC
  Filled 2024-08-22: qty 30, 30d supply, fill #0

## 2024-08-29 ENCOUNTER — Other Ambulatory Visit: Payer: Self-pay

## 2024-08-29 MED ORDER — TRIAMCINOLONE ACETONIDE 0.1 % EX OINT
1.0000 | TOPICAL_OINTMENT | Freq: Two times a day (BID) | CUTANEOUS | 0 refills | Status: AC
  Filled 2024-08-29: qty 30, 30d supply, fill #0

## 2024-09-01 ENCOUNTER — Other Ambulatory Visit: Payer: Self-pay

## 2024-09-01 MED ORDER — TRINTELLIX 20 MG PO TABS
20.0000 mg | ORAL_TABLET | Freq: Every day | ORAL | 2 refills | Status: AC
  Filled 2024-09-01: qty 30, 30d supply, fill #0

## 2024-09-13 ENCOUNTER — Other Ambulatory Visit: Payer: Self-pay

## 2024-09-13 MED ORDER — CARBAMAZEPINE ER 200 MG PO TB12
200.0000 mg | ORAL_TABLET | Freq: Every day | ORAL | 3 refills | Status: AC
  Filled 2024-09-13: qty 30, 30d supply, fill #0
